# Patient Record
Sex: Male | Born: 1970 | Race: White | Hispanic: No | Smoking: Former smoker
Health system: Southern US, Community
[De-identification: ages and names within clinical notes are randomized; demographics above are authoritative.]

## PROBLEM LIST (undated history)

## (undated) DIAGNOSIS — G8929 Other chronic pain: Secondary | ICD-10-CM

## (undated) DIAGNOSIS — T50902A Poisoning by unspecified drugs, medicaments and biological substances, intentional self-harm, initial encounter: Secondary | ICD-10-CM

## (undated) DIAGNOSIS — M549 Dorsalgia, unspecified: Secondary | ICD-10-CM

## (undated) HISTORY — PX: OTHER SURGICAL HISTORY: SHX169

---

## 2001-11-16 ENCOUNTER — Inpatient Hospital Stay (HOSPITAL_COMMUNITY): Admission: EM | Admit: 2001-11-16 | Discharge: 2001-11-18 | Payer: Self-pay | Admitting: Psychiatry

## 2005-08-05 ENCOUNTER — Inpatient Hospital Stay (HOSPITAL_COMMUNITY): Admission: RE | Admit: 2005-08-05 | Discharge: 2005-08-09 | Payer: Self-pay | Admitting: Psychiatry

## 2005-08-06 ENCOUNTER — Ambulatory Visit: Payer: Self-pay | Admitting: Psychiatry

## 2005-12-31 ENCOUNTER — Inpatient Hospital Stay (HOSPITAL_COMMUNITY): Admission: AD | Admit: 2005-12-31 | Discharge: 2006-01-02 | Payer: Self-pay | Admitting: Psychiatry

## 2005-12-31 ENCOUNTER — Ambulatory Visit: Payer: Self-pay | Admitting: Psychiatry

## 2006-07-16 ENCOUNTER — Inpatient Hospital Stay (HOSPITAL_COMMUNITY): Admission: AD | Admit: 2006-07-16 | Discharge: 2006-07-18 | Payer: Self-pay | Admitting: Psychiatry

## 2006-07-16 ENCOUNTER — Ambulatory Visit: Payer: Self-pay | Admitting: Psychiatry

## 2007-11-20 ENCOUNTER — Other Ambulatory Visit: Payer: Self-pay | Admitting: Emergency Medicine

## 2007-11-20 ENCOUNTER — Ambulatory Visit: Payer: Self-pay | Admitting: Psychiatry

## 2007-11-20 ENCOUNTER — Inpatient Hospital Stay (HOSPITAL_COMMUNITY): Admission: AD | Admit: 2007-11-20 | Discharge: 2007-11-23 | Payer: Self-pay | Admitting: Psychiatry

## 2008-10-01 ENCOUNTER — Emergency Department (HOSPITAL_BASED_OUTPATIENT_CLINIC_OR_DEPARTMENT_OTHER): Admission: EM | Admit: 2008-10-01 | Discharge: 2008-10-01 | Payer: Self-pay | Admitting: Emergency Medicine

## 2008-12-09 ENCOUNTER — Ambulatory Visit: Payer: Self-pay | Admitting: Diagnostic Radiology

## 2008-12-09 ENCOUNTER — Emergency Department (HOSPITAL_BASED_OUTPATIENT_CLINIC_OR_DEPARTMENT_OTHER): Admission: EM | Admit: 2008-12-09 | Discharge: 2008-12-10 | Payer: Self-pay | Admitting: Emergency Medicine

## 2009-01-09 ENCOUNTER — Other Ambulatory Visit: Payer: Self-pay

## 2009-01-09 ENCOUNTER — Inpatient Hospital Stay (HOSPITAL_COMMUNITY): Admission: AD | Admit: 2009-01-09 | Discharge: 2009-01-12 | Payer: Self-pay | Admitting: Psychiatry

## 2009-01-09 ENCOUNTER — Ambulatory Visit: Payer: Self-pay | Admitting: Psychiatry

## 2010-04-29 ENCOUNTER — Emergency Department (HOSPITAL_BASED_OUTPATIENT_CLINIC_OR_DEPARTMENT_OTHER)
Admission: EM | Admit: 2010-04-29 | Discharge: 2010-04-29 | Disposition: A | Discharge status: Home / Self Care | Payer: Self-pay | Attending: Emergency Medicine | Admitting: Emergency Medicine

## 2010-04-29 ENCOUNTER — Emergency Department (INDEPENDENT_AMBULATORY_CARE_PROVIDER_SITE_OTHER): Payer: Self-pay

## 2010-04-29 DIAGNOSIS — N4340 Spermatocele of epididymis, unspecified: Secondary | ICD-10-CM | POA: Insufficient documentation

## 2010-04-29 DIAGNOSIS — N509 Disorder of male genital organs, unspecified: Secondary | ICD-10-CM

## 2010-04-29 DIAGNOSIS — F341 Dysthymic disorder: Secondary | ICD-10-CM | POA: Insufficient documentation

## 2010-04-29 LAB — URINALYSIS, ROUTINE W REFLEX MICROSCOPIC
Bilirubin Urine: NEGATIVE
Glucose, UA: NEGATIVE mg/dL
Hgb urine dipstick: NEGATIVE
Ketones, ur: NEGATIVE mg/dL
Protein, ur: NEGATIVE mg/dL
Specific Gravity, Urine: 1.008 (ref 1.005–1.030)

## 2010-05-29 LAB — CBC
Hemoglobin: 15.3 g/dL (ref 13.0–17.0)
Platelets: 270 10*3/uL (ref 150–400)
RBC: 5.05 MIL/uL (ref 4.22–5.81)
WBC: 6.1 10*3/uL (ref 4.0–10.5)

## 2010-05-29 LAB — BASIC METABOLIC PANEL
BUN: 7 mg/dL (ref 6–23)
Creatinine, Ser: 1.09 mg/dL (ref 0.4–1.5)
GFR calc Af Amer: >60 mL/min (ref >60)
GFR calc non Af Amer: >60 mL/min (ref >60)

## 2010-05-29 LAB — URINALYSIS, ROUTINE W REFLEX MICROSCOPIC
Bilirubin Urine: NEGATIVE
Glucose, UA: NEGATIVE mg/dL
Hgb urine dipstick: NEGATIVE
Specific Gravity, Urine: 1.021 (ref 1.005–1.030)
pH: 6.5 (ref 5.0–8.0)

## 2010-05-29 LAB — DIFFERENTIAL
Basophils Relative: 0 % (ref 0–1)
Eosinophils Absolute: 0.3 10*3/uL (ref 0.0–0.7)
Lymphocytes Relative: 37 % (ref 12–46)
Lymphs Abs: 2.3 10*3/uL (ref 0.7–4.0)
Monocytes Absolute: 0.8 10*3/uL (ref 0.1–1.0)
Monocytes Relative: 14 % — ABNORMAL HIGH (ref 3–12)
Neutrophils Relative %: 44 % (ref 43–77)

## 2010-05-29 LAB — RAPID URINE DRUG SCREEN, HOSP PERFORMED
Amphetamines: NOT DETECTED
Opiates: NOT DETECTED
Tetrahydrocannabinol: NOT DETECTED

## 2010-06-26 ENCOUNTER — Emergency Department (HOSPITAL_COMMUNITY)
Admission: EM | Admit: 2010-06-26 | Discharge: 2010-06-27 | Disposition: A | Discharge status: Other Acute Inpatient Hospital | Payer: Self-pay | Attending: Emergency Medicine | Admitting: Emergency Medicine

## 2010-06-26 DIAGNOSIS — F329 Major depressive disorder, single episode, unspecified: Secondary | ICD-10-CM | POA: Insufficient documentation

## 2010-06-26 DIAGNOSIS — R45851 Suicidal ideations: Secondary | ICD-10-CM | POA: Insufficient documentation

## 2010-06-26 DIAGNOSIS — F1910 Other psychoactive substance abuse, uncomplicated: Secondary | ICD-10-CM | POA: Insufficient documentation

## 2010-06-26 DIAGNOSIS — F3289 Other specified depressive episodes: Secondary | ICD-10-CM | POA: Insufficient documentation

## 2010-06-26 LAB — RAPID URINE DRUG SCREEN, HOSP PERFORMED
Amphetamines: NOT DETECTED
Cocaine: POSITIVE — AB
Opiates: POSITIVE — AB

## 2010-06-26 LAB — URINALYSIS, ROUTINE W REFLEX MICROSCOPIC
Bilirubin Urine: NEGATIVE
Hgb urine dipstick: NEGATIVE
Nitrite: NEGATIVE
Specific Gravity, Urine: 1.01 (ref 1.005–1.030)
Urobilinogen, UA: 0.2 mg/dL (ref 0.0–1.0)

## 2010-06-26 LAB — BASIC METABOLIC PANEL
Calcium: 9.4 mg/dL (ref 8.4–10.5)
GFR calc Af Amer: >60 mL/min (ref >60)
Glucose, Bld: 94 mg/dL (ref 70–99)
Potassium: 3.7 mEq/L (ref 3.5–5.1)
Sodium: 139 mEq/L (ref 135–145)

## 2010-06-26 LAB — CBC
HCT: 43 % (ref 39.0–52.0)
MCH: 30.3 pg (ref 26.0–34.0)
MCHC: 36 g/dL (ref 30.0–36.0)
MCV: 84 fL (ref 78.0–100.0)

## 2010-06-26 LAB — DIFFERENTIAL
Basophils Absolute: 0 10*3/uL (ref 0.0–0.1)
Eosinophils Absolute: 0.1 10*3/uL (ref 0.0–0.7)
Eosinophils Relative: 1 % (ref 0–5)
Neutro Abs: 5 10*3/uL (ref 1.7–7.7)

## 2010-06-27 ENCOUNTER — Inpatient Hospital Stay (HOSPITAL_COMMUNITY)
Admission: AD | Admit: 2010-06-27 | Discharge: 2010-06-28 | DRG: 897 | Disposition: A | Discharge status: Home / Self Care | Payer: PRIVATE HEALTH INSURANCE | Source: Ambulatory Visit | Attending: Psychiatry | Admitting: Psychiatry

## 2010-06-27 DIAGNOSIS — F3289 Other specified depressive episodes: Secondary | ICD-10-CM

## 2010-06-27 DIAGNOSIS — F329 Major depressive disorder, single episode, unspecified: Secondary | ICD-10-CM

## 2010-06-27 DIAGNOSIS — F1120 Opioid dependence, uncomplicated: Principal | ICD-10-CM

## 2010-06-27 DIAGNOSIS — F1910 Other psychoactive substance abuse, uncomplicated: Secondary | ICD-10-CM

## 2010-06-27 LAB — HEPATIC FUNCTION PANEL
ALT: 12 U/L (ref 0–53)
Alkaline Phosphatase: 70 U/L (ref 39–117)
Bilirubin, Direct: 0.1 mg/dL (ref 0.0–0.3)
Total Bilirubin: 0.3 mg/dL (ref 0.3–1.2)

## 2010-06-28 DIAGNOSIS — F1920 Other psychoactive substance dependence, uncomplicated: Secondary | ICD-10-CM

## 2010-07-12 NOTE — Discharge Summary (Signed)
NAME:  Marcus [name], Marcus [name] NO.:  [id number]   MEDICAL RECORD NO.:  [id number]          PATIENT TYPE:  IPS   LOCATION:  0303                          FACILITY:  [facility]   PHYSICIAN:  Anselm Jungling, MD  DATE OF BIRTH:  [birth year]   DATE OF ADMISSION:  12/31/2005  DATE OF DISCHARGE:  01/02/2006                               DISCHARGE SUMMARY   IDENTIFYING DATA AND REASON FOR ADMISSION:  The patient is a 40 year old  separated white male, homeless, unemployed, who was seeking  detoxification from poly-substance abuse.  This was his third [facility] admit.  Apparently he had made no effort to follow-up on discharge and aftercare  recommendations following his last admission to be [facility].  Please refer to  the admission note for further details pertaining to the symptoms,  circumstances and history that led to his hospitalization.  Please also  refer to previous admission and discharge summaries.  He was given  initial Axis I diagnosis of polysubstance abuse/dependence.   MEDICAL AND LABORATORY:  The patient was medically and physically  evaluated by the psychiatric nurse practitioner.  He was in good health,  essentially, without any active or chronic medical problems.   HOSPITAL COURSE:  The patient was admitted to the adult inpatient  psychiatric service.  He presented as a well nourished, well developed  male who initially was disheveled, tired, depressed, without any signs  or symptoms of psychosis.  He was involved in milieu therapy and placed  on a detoxification protocol of Librium 25 mg p.r.n.  In addition,  Seroquel 25 mg p.o. t.i.d. was initiated to address ongoing problems  with agitation and sleep.  Symmetrel 100 mg b.i.d. was initiated to  address cocaine cravings. Effexor XR 112.5 mg daily was added to address  depression.  The patient expressed interest in residential chemical  dependency treatment, which we supported, but due to his funding  situation, this was not  possible.  The patient was also initiated on a  trial of Lexapro 10 mg daily, to which he agreed.  This was well  tolerated.  On the third hospital day, the patient indicated that, since  there was no option for residential treatment, that he wished to re-  enter a methadone program at [facility], with whom he had consulted in  the past.  He indicated that he wanted to do that on that very day.  At  this point, the patient did not appear to require any further inpatient  treatment.  He was discharged so that he could complete his plan to  connect with the above program.   AFTERCARE:  The patient was discharged with a plan to follow up at [facility] on January 05, 2006, and with [facility] on January 08, 2006.   DISCHARGE MEDICATIONS:  Symmetrel 100 mg b.i.d., Effexor XR 175 mg  daily, Lexapro 10 mg daily, and Risperdal 0.5 mg t.i.d., which was  substituted for Seroquel, as the patient described the Seroquel as being  excessively sedating.   DISCHARGE DIAGNOSES:  AXIS I:  Polysubstance abuse/dependence and depressive disorder  NOS.  AXIS II:          Deferred.  AXIS III:         No acute or chronic illnesses.  AXIS IV:          Stressors severe.  AXIS V:           GAF on discharge 60.      Anselm Jungling, MD  Electronically Signed     SPB/MEDQ  D:  02/04/2006  T:  02/04/2006  Job:  [id number]

## 2010-07-12 NOTE — H&P (Signed)
NAME:  Marcus [name], Marcus [name] NO.:  [id number]   MEDICAL RECORD NO.:  [id number]          PATIENT TYPE:  IPS   LOCATION:  0301                          FACILITY:  [facility]   PHYSICIAN:  Landry Corporal, N.P.    DATE OF BIRTH:  1970/06/26   DATE OF ADMISSION:  08/05/2005  DATE OF DISCHARGE:                         PSYCHIATRIC ADMISSION ASSESSMENT   A single white male involuntarily admitted August 05, 2005.   HISTORY OF PRESENT ILLNESS:  The patient presents with a history of  substance abuse and depressive symptoms. Has been using OxyContin, taking up  to 5 to 6 a day, along with some Vicodin and Percocet. Has been using for  the past 3 to 4 months. Has been taking up to anywhere from 15 to 20 Vicodin  or Percocets daily. His last use was on August 05, 2005. The patient has been  tapering himself off of his medications for the past 2 weeks, with his last  use on August 05, 2005. He also reports some recent Valium use as well. He  wants to stop using drugs, to be there for his child. He is tired of his  inactivity and lack of motivation. He denies any suicidal or homicidal  thoughts or psychotic symptoms.   PAST PSYCHIATRIC HISTORY:  The patient was here in 2003 when he overdosed on  Xanax and was drinking. In the past, he has been on Wellbutrin and Effexor.  He has been on Effexor for approximately 2 years.   SOCIAL HISTORY:  This is a 40 year old single white male who has a 47-year-  old son. He lives with his girlfriend and his child. He is a Naval architect.  He denies any legal issues. He has 10 years of education.   FAMILY HISTORY:  Denies.   ALCOHOL AND DRUG HISTORY:  The patient chews tobacco. He drinks on occasion.  Denies any IV drug use.   PRIMARY CARE PHYSICIAN:  Unknown.   MEDICAL PROBLEMS:  The patient states he has chronic back pain and is unable  to see his family doctor due to his urine drug screen being positive for  marijuana.   MEDICATIONS:  None.   ALLERGIES:  FLEXERIL for muscle pain.   PHYSICAL EXAMINATION:  The patient was assessed at [facility].  Temperature was 97.1, 59 heart rate, 16 respirations, blood pressure 147/90.  He is 182 pounds, approximately 5 feet 10 inches tall. He is a healthy  appearing male.   LABORATORY DATA:  TSH is 1.941. Urinalysis is negative. Glucose is 124.  Alcohol level is less than 0.01. Urine drug screen is positive for  barbiturates, positive for benzodiazepines, positive for cocaine, positive  for marijuana. Acetaminophen level was high at 25.6; repeat acetaminophen  level at 10.6.   MENTAL STATUS EXAMINATION:  He is a fully alert, cooperative, casually  dressed male. Fair eye contact. Speech is clear. The patient feels  depressed. The patient's affect is flat. Thought processes coherent. There  is no evidence of psychosis. __________ intact. Memory is good. Judgment is  fair. Insight is fair. Poor impulse control.   AXIS I:  Polysubstance dependence. Depressive  disorder, not otherwise  specified.  AXIS II:  Deferred.  AXIS III:  Back pain.  AXIS IV:  Medical problems. Other psychosocial problems.  AXIS V:  Current is 30.   PLAN:  The plan is to stabilize mood and thinking, contract for safety. Will  detox the patient with Librium protocol. Work on relapse prevention. Will  resume Effexor. The patient reports benefits from Effexor. Risks and  benefits of the medication were discussed. Case manager to look over  potential rehabilitation programs. Will consider family session with his  girlfriend. Tentative length of stay is 5 to 6 days.      Landry Corporal, N.P.     JO/MEDQ  D:  08/06/2005  T:  08/07/2005  Job:  [id number]

## 2010-07-12 NOTE — H&P (Signed)
NAME:  Marcus [name], Marcus [name]                     ACCOUNT NO.:  [id number]   MEDICAL RECORD NO.:  [id number]                   PATIENT TYPE:  IPS   LOCATION:  0301                                 FACILITY:  [facility]   PHYSICIAN:  Jeanice Lim, M.D.              DATE OF BIRTH:  11/23/1970   DATE OF ADMISSION:  11/16/2001  DATE OF DISCHARGE:                         PSYCHIATRIC ADMISSION ASSESSMENT   IDENTIFYING INFORMATION:  This is a 40 year old white male who is currently  divorced and an involuntary admission.   HISTORY OF PRESENT ILLNESS:  This patient presented to the emergency room  after an intentional ingestion of approximately 16 tablets of Xanax 0.5 mg  that belonged to his girlfriend.  He denies any abuse of benzodiazepines.  He reports that he was just feeling really badly yesterday, had been quite  depressed, did not go to work as he usually does and just felt so terrible  that he decided to take pills about 2:00 in the afternoon.  He reports that  he had visited with his brother over the weekend.  His brother had come to  see him and he felt inadequate compared to his brother, who was currently in  the army and getting ready to go to the war in [place], that he felt inadequate  next to him, inadequate and unsuccessful.  The patient endorses a long  history of depression for many weeks and possibly months, feeling anhedonic,  unable to enjoy things anymore or find any happiness.  He also endorses  chronic dull headaches, irritability and inability to concentrate, unable to  sleep more than three hours a night, four maximum.  He states he usually  falls asleep around 12:30 or 1 in the morning and then rapidly reawakens and  wanders the house at night.  He reports his appetite is veracious but he has  no weight change.  The patient reports intermittent suicidal thoughts but  denies any specific plan until he took the medication impulsively.  He does  have regular access to hunting  weapons and hunts frequently.  He denies any  homicidal ideation.  Denies any feelings of paranoia.  Denies any  hallucinations.  He has episodes of irritability and anger but he gives no  history of symptoms of mania.   PAST PSYCHIATRIC HISTORY:  This is patient's first inpatient psychiatric  admission, first admission to [facility].  He was  diagnosed with depression previously approximately six years ago, when he  lived in [place] and was treated at that time with Effexor, which he  felt worked quite well for him.  He stopped cold [place] on his own and  suffered considerable withdrawal symptoms because he did not have any health  insurance to pay for the medication at the time.  He also had considerable  sexual side effects on the Effexor with delayed ejaculation but states the  side effects are not his concern  at this time.  He also has tried Zoloft,  which made him dizzy and made him feel sick to his stomach.  He denies any  prior history of suicide attempts.   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  He has been unable to work regularly because of his symptoms that  bothered him and he has felt so depressed.  He has one previous marriage.  Currently lives with his fiance and 68-month-old son.  He states he would  like to get married again if he could just get his act together.  The  patient was raised by his grandparents and the reasons for that are, at this  point, unclear.   FAMILY HISTORY:  Father with history of alcohol abuse.   ALCOHOL/DRUG HISTORY:  The patient drank several beers yesterday prior to  overdosing on the Xanax.  He endorses using alcohol approximately weekly,  binges with his friends but he denies any addiction, denies any withdrawal  symptoms.  He has used cocaine weekly over the past several weeks and began  using cocaine at age 29.  He has also used marijuana on a regular  basis  since age 65.   PAST MEDICAL HISTORY:  The patient has no regular primary care Vernee Baines.  Medical problems are kind of a chronic dull headache and chronic back pain  related to muscle strains and work.  No history of seizures.   MEDICATIONS:  None.   ALLERGIES:  FLEXARIL.   POSITIVE PHYSICAL FINDINGS:  The patient's physical examination was done at  the [facility] in the emergency room and is essentially  unremarkable.  He was observed for some time and did receive IV fluids.  He  was involuntarily petitioned after he was being resistant to staff and  threatening to leave the emergency room and rip his IV out.  He has been  cooperative on the unit.  Vital signs, on admission, were temperature 98.3,  pulse 67, respirations 20, blood pressure 126/82.  He is 6 feet 1 inch tall  and weighed 174 pounds.   LABORATORY DATA:  Urine drug screen includes positive results for  benzodiazepines, cannabinoids and cocaine.  Alcohol level is 15.  Basic  metabolic panel reveals his chloride mildly elevated at 108.  Other  electrolytes are within normal limits.  BUN 5, creatinine 0.8.   MENTAL STATUS EXAM:  This is a healthy, medium-built, muscular male fully  alert and in no acute distress.  He has quite a depressed affect.  He is  cooperative and pleasant, polite and emotionally available and direct about  his concerns.  Speech is normal.  Mood is depressed and mildly irritable.  He minimizes and has considerable denial regarding his substance abuse and  its role in how it affects his mood.  Thought process is logical without  deficits.  He is concerned about his depressive symptoms and has good  insight, recognizing that he is just not able to find any happiness anymore  and is interested primarily in regaining a normal sleep/wake pattern and  alleviating his depressive symptoms.  He does continue to have suicidal ideation but is able to contract for safety on the unit and  has no specific  plan.  He does have access to hunting weapons and hunts frequently.  No  evidence of homicidal ideation.  No evidence of psychosis.  No paranoia.  No  internal stimulation.  Cognitively, he is  intact x 3.   DIAGNOSES:   AXIS I:  1. Major depressive disorder, recurrent, severe.  2. Polysubstance abuse; rule out dependence.   AXIS II:  Deferred.   AXIS III:  1. Status post benzodiazepine overdose.  2. Headache not otherwise specified.   AXIS IV:  Moderate (financial problems).   AXIS V:  Current 24; past year 1.   PLAN:  Involuntarily commit the patient to evaluate his mood and to  alleviate his neurovegetative symptoms and alleviate his suicidal ideation.  He is able to contract for safety on the unit.  We have him on 15-minute  checks.  We will immediately begin intensive group and individual  psychotherapy and, today, we did considerable counseling related to  substance abuse and its effects on his brain in combination with his  depression.  We have elected to start  him on Prozac 20 mg p.o. daily and Librium 25 mg p.o. q.6h. p.r.n. for any  withdrawal symptoms.  He is also on trazodone 50 mg p.o. q.h.s.  His thyroid  panel is currently pending.   ESTIMATED LENGTH OF STAY:  Five days.     Margaret A. Stephannie Peters                   Jeanice Lim, M.D.    MAS/MEDQ  D:  11/16/2001  T:  11/17/2001  Job:  [phone]

## 2010-07-12 NOTE — Discharge Summary (Signed)
NAME:  BRASON, BERTHELOT NO.:  [id number]   MEDICAL RECORD NO.:  [id number]          PATIENT TYPE:  IPS   LOCATION:  0508                          FACILITY:  [facility]   PHYSICIAN:  Geoffery Lyons, M.D.      DATE OF BIRTH:  10-22-1970   DATE OF ADMISSION:  11/20/2007  DATE OF DISCHARGE:  11/23/2007                               DISCHARGE SUMMARY   CHIEF COMPLAINT/PRESENT ILLNESS:  This was one of several admissions to  [facility] for this 40 year old male that requested  admission.  Endorsed he was depressed about losing his job, cannot  support his family, persistent use of opiates.  He had been using  morphine and OxyContin, crushing and snoring or swallowing it.  When he  does not take it, he goes through withdrawal with cramps, sweats and  diarrhea.   PAST PSYCHIATRIC HISTORY:  Last time admitted in 2007.  No current  follow-up.  [facility]  He had been seen in [facility] and [facility].  Multiple trials of SSRIs, but nothing at this time.   ALCOHOL AND DRUG HISTORY:  As already stated, persistence use of  opiates.   MEDICAL HISTORY:  Noncontributory.   MEDICATIONS:  1. Effexor XR 150 mg per day.  2. Klonopin as needed.   Physical exam failed to show any acute findings   LABORATORY WORK:  Results not available in the chart.   MENTAL STATUS EXAMINATION:  Reveals an alert cooperative male.  Mood  anxious, depressed.  Affect anxious, depressed.  Thought process is  logical, coherent and relevant.  Endorsed suicidal ruminations, no plan,  no intent.  Feeling overwhelmed with the way things have been going.  Dealing with a lot of shame and guilt for his dependence on opiates.  Also dealing with losing his job.  No delusions.  No hallucinations.  Cognition well-preserved.   ADMITTING DIAGNOSES:  AXIS I:  Opiate dependence.  Depressive disorder,  not otherwise specified.  AXIS II:  No diagnosis.  AXIS III:  No diagnosis.  AXIS IV:   Moderate.  AXIS V:  Global Assessment of Functioning upon admission 35, highest  Global Assessment of Functioning in the last year 60.   COURSE IN THE HOSPITAL:  He was admitted, started in individual and  group psychotherapy.  He was able to settle down.  We detoxed with  clonidine.  He was maintained on the Effexor.  He was given some Ambien  for sleep.  He did admit that he was also using cocaine, heroin and  drinking.  As the hospitalization progressed, he endorsed long-term  issues with opiates, having a hard time anticipating or thinking that he  was not going to be on opiates anymore.  Prior history of issues with  cravings and persistent relapses.  Concerned about his ability to  maintain abstinence.  He has a girlfriend who is pregnant with his  child.  He has a 47-year-old son that he takes care of, and he did not  want to risk relapsing again.  He had been on [facility], taking methadone.  Explored the  possibility of using Suboxone given the fact of the  chronicity of his opiate dependency and his inability to stay abstinent,  and the fact that he was actively withdrawing, we went ahead and  starting using Suboxone.  He tolerated the Suboxone quite well as far as  the withdrawal, as well as the craving.  He was able to find out that  [facility] was going to maintain him on the Suboxone, so went ahead and  discharged to outpatient follow-up.  Upon discharge, more hopeful in  that he has a different strategy to deal with the opiate dependency.  Willing to pursue outpatient treatment.   DISCHARGE DIAGNOSES:  AXIS I:  Opiate dependence, cocaine, marijuana  abuse.  Depressive disorder, not otherwise specified.  AXIS II:  No  diagnosis.  AXIS III:  No diagnosis.  AXIS IV:  Moderate.  AXIS V:  Global Assessment of Functioning upon discharge 55-60.   Discharged on Suboxone 8 mg 1/2 in the morning and 1/2 in the afternoon,  Effexor XR 150 mg per day and Ambien 10 at bedtime for sleep.  Follow  up  at [facility].      Geoffery Lyons, M.D.  Electronically Signed     IL/MEDQ  D:  12/07/2007  T:  12/07/2007  Job:  [id number]

## 2010-07-12 NOTE — Discharge Summary (Signed)
NAME:  Marcus [name], KRIST NO.:  [id number]   MEDICAL RECORD NO.:  [id number]          PATIENT TYPE:  IPS   LOCATION:  0301                          FACILITY:  [facility]   PHYSICIAN:  Geoffery Lyons, M.D.      DATE OF BIRTH:  Jun 13, 1970   DATE OF ADMISSION:  08/05/2005  DATE OF DISCHARGE:  08/09/2005                                 DISCHARGE SUMMARY   CHIEF COMPLAINT AND PRESENT ILLNESS:  This was the second admission to [facility] for this 40 year old male.  History of substance  abuse and depressive symptoms.  Using OxyContin, taking up to 5 or 6 per day  along with some Vicodin and Percocet.  Had been using for the past 3-4  months.  Had been taking up to anywhere from 15-20 Vicodin or Percocet.  Last use was August 05, 2005.  Has been tapering himself off his medication  over the past two weeks.  Last use August 05, 2005.  Also reports some recent  Valium use.  Wants to stop using drugs to be there for his child.  Endorsed  tiredness, inactivity, lack of motivation.   PAST PSYCHIATRIC HISTORY:  Was admitted in 2003 when he overdosed on Xanax.  Was drinking.  He had been on Wellbutrin and Effexor in the past.   ALCOHOL/DRUG HISTORY:  As already stated, persistent use of opioids.  Drinks  on occasion.  As of recently, had gotten on Valium.   MEDICAL HISTORY:  Chronic back pain.   MEDICATIONS:  None.   PHYSICAL EXAMINATION:  Performed and failed to show any acute findings.   LABORATORY DATA:  Blood chemistry with sodium 141, potassium 4.2, glucose  94.  Liver enzymes with SGOT 23, SGPT 36, total bilirubin 0.8.  TSH 1.941.   MENTAL STATUS EXAM:  Alert, cooperative male.  Casually dressed.  Fair eye  contact.  Speech was clear, normal rate, tempo and production.  Feeling  depressed and anxious.  Affect is constricted.  No delusions.  No suicidal  or homicidal ideation.  Cognition was well-preserved.   ADMISSION DIAGNOSES:  AXIS I:  Polysubstance  dependence.  Depressive  disorder not otherwise specified.  AXIS II:  No diagnosis.  AXIS III:  Back pain.  AXIS IV:  Moderate.  AXIS V:  GAF upon admission 30; highest GAF in the last year 60.   LABORATORY DATA:  Urine drug screen was positive for benzodiazepines,  cocaine and marijuana.   HOSPITAL COURSE:  He was admitted.  He was detoxified with Librium and  clonidine.  He was given trazodone for sleep.  He was given Symmetrel 100 mg  twice a day.  He was started on Effexor and Neurontin.  He endorsed he had  been messing up.  He was treated for chronic pain.  UDS was positive for  marijuana.  He was fired from the clinic.  Endorsed increased anxiety,  always jittery and there was underlying depression.  Endorsed no energy, no  motivations.  Doing better on Effexor.  Mood fluctuation.  Increased use of  opiates as well as cocaine.  Affecting his  job, relationship.  Committed to  abstaining.  Concerned about not being able to use benzodiazepines as he  used Valium in the afternoon, makes him feel so much better.  After he was  in [facility] in 2003, he failed to follow up.  Family  session with his girlfriend.  He was endorsing that he was committed to  abstinence.  He felt he was ready to move on, more aware of the underlying  depression, anger spells, anxiety.  We worked on the Effexor and the  Neurontin.  He started feeling better and, on August 09, 2005, he was in full  contact with reality.  There were no active suicidal or homicidal ideation.  He felt committed to abstinence.  His mood had improved.  He had taken some  Seroquel during his stay.  Felt it was beneficial.  Endorsed that he was not  allergic to it as initially stated.  As he was in full contact with reality,  fully detoxed, no active suicidal or homicidal ideation, we went ahead and  discharged to outpatient follow-up.   DISCHARGE DIAGNOSES:  AXIS I:  Polysubstance dependence.  Depressive   disorder not otherwise specified.  Anxiety disorder not otherwise specified.  AXIS II:  No diagnosis.  AXIS III:  Degenerative disk disease.  AXIS IV:  Moderate.  AXIS V:  GAF upon discharge 50-55.   DISCHARGE MEDICATIONS:  1.  Effexor XR with plans to go 75 mg for four days, then 112.5 mg for four      days, then 150 mg per day.  2.  Neurontin 300 mg three times a day.  3.  Symmetrel 100 mg twice a day.  4.  Trazodone 50 mg at bedtime.  5.  Seroquel 50 mg every six hours as needed.   FOLLOWUP:  [facility].      Geoffery Lyons, M.D.  Electronically Signed     IL/MEDQ  D:  08/26/2005  T:  08/26/2005  Job:  [id number]
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
## 2010-09-11 NOTE — Assessment & Plan Note (Signed)
  NAME:  ANIRUDDH, CIAVARELLA NO.:  [id number]  MEDICAL RECORD NO.:  [id number]  LOCATION:                                 FACILITY:  PHYSICIAN:  Anselm Jungling, MD  DATE OF BIRTH:  March 05, 1970  DATE OF ADMISSION:  06/28/2010 DATE OF DISCHARGE:                      PSYCHIATRIC ADMISSION ASSESSMENT   This was a 40 year old male who was admitted to the adult milieu wanting to receive Suboxone.  He was making suicidal threats that if he did not receive Suboxone, that his mother would "have to bury him."  The patient was using heroin in addition to use of opiates.  Plan was on admission to place patient on the Clonidine protocol to aid with withdrawal symptoms from use of opiates.  The patient was requesting discharge.  He denied any suicidal thoughts but wanting referrals to outside resources to possibly get placed on Suboxone.  This note was done by reviewing records. This was a patient of Dr. Herbie Drape, N.P.   ______________________________ Anselm Jungling, MD    JO/MEDQ  D:  09/09/2010  T:  09/09/2010  Job:  [id number]  Electronically Signed by Limmie Patricia.P. on 09/09/2010 04:32:30 PM Electronically Signed by Nelly Rout MD on 09/11/2010 11:39:53 AM

## 2010-11-25 LAB — DIFFERENTIAL
Basophils Relative: 0
Eosinophils Relative: 1
Lymphocytes Relative: 22
Monocytes Relative: 6
Neutrophils Relative %: 70

## 2010-11-25 LAB — RAPID URINE DRUG SCREEN, HOSP PERFORMED
Benzodiazepines: POSITIVE — AB
Cocaine: POSITIVE — AB
Opiates: NOT DETECTED
Tetrahydrocannabinol: NOT DETECTED

## 2010-11-25 LAB — CBC
HCT: 43.1
Hemoglobin: 14.7
MCHC: 34
MCV: 93
Platelets: 313
RBC: 4.63
RDW: 13
WBC: 9.6

## 2010-11-25 LAB — BASIC METABOLIC PANEL
CO2: 31
GFR calc Af Amer: >60
GFR calc non Af Amer: >60
Glucose, Bld: 98

## 2010-11-25 LAB — TRICYCLICS SCREEN, URINE: TCA Scrn: NOT DETECTED

## 2010-11-25 LAB — ACETAMINOPHEN LEVEL: Acetaminophen (Tylenol), Serum: <10 — ABNORMAL LOW

## 2010-11-25 LAB — ETHANOL: Alcohol, Ethyl (B): 61 — ABNORMAL HIGH

## 2011-06-18 ENCOUNTER — Emergency Department (HOSPITAL_BASED_OUTPATIENT_CLINIC_OR_DEPARTMENT_OTHER)
Admission: EM | Admit: 2011-06-18 | Discharge: 2011-06-18 | Disposition: A | Discharge status: Home / Self Care | Payer: Medicaid Other | Attending: Emergency Medicine | Admitting: Emergency Medicine

## 2011-06-18 ENCOUNTER — Encounter (HOSPITAL_BASED_OUTPATIENT_CLINIC_OR_DEPARTMENT_OTHER): Payer: Self-pay | Admitting: Emergency Medicine

## 2011-06-18 DIAGNOSIS — M545 Low back pain, unspecified: Secondary | ICD-10-CM | POA: Insufficient documentation

## 2011-06-18 DIAGNOSIS — G8929 Other chronic pain: Secondary | ICD-10-CM | POA: Insufficient documentation

## 2011-06-18 HISTORY — DX: Dorsalgia, unspecified: M54.9

## 2011-06-18 HISTORY — DX: Other chronic pain: G89.29

## 2011-06-18 NOTE — ED Notes (Signed)
Pt states he has been out of medications  x 5 days, insurance reinstated today, talked with pain management office and they instructed him to come to ed for pain control, bilateral le numbness and weakness

## 2011-06-18 NOTE — ED Notes (Signed)
Pt thinks he may be having some withdrawal symptoms due to being out of medications x 5 days.

## 2011-06-18 NOTE — ED Notes (Signed)
Pt has chronic lower back pain. Pt states he has been out of pain medications x 5 days. Pt states he just got insurance reinstated today and has apt with pain management mon.

## 2012-05-18 ENCOUNTER — Encounter (HOSPITAL_COMMUNITY): Payer: Self-pay | Admitting: *Deleted

## 2012-05-18 ENCOUNTER — Emergency Department (HOSPITAL_COMMUNITY)
Admission: EM | Admit: 2012-05-18 | Discharge: 2012-05-19 | Disposition: A | Discharge status: Other Acute Inpatient Hospital | Payer: Medicaid Other | Source: Home / Self Care | Attending: Emergency Medicine | Admitting: Emergency Medicine

## 2012-05-18 DIAGNOSIS — F1110 Opioid abuse, uncomplicated: Secondary | ICD-10-CM | POA: Insufficient documentation

## 2012-05-18 DIAGNOSIS — F1310 Sedative, hypnotic or anxiolytic abuse, uncomplicated: Secondary | ICD-10-CM | POA: Insufficient documentation

## 2012-05-18 DIAGNOSIS — M549 Dorsalgia, unspecified: Secondary | ICD-10-CM | POA: Insufficient documentation

## 2012-05-18 DIAGNOSIS — G8929 Other chronic pain: Secondary | ICD-10-CM | POA: Insufficient documentation

## 2012-05-18 DIAGNOSIS — F1910 Other psychoactive substance abuse, uncomplicated: Secondary | ICD-10-CM

## 2012-05-18 DIAGNOSIS — R45851 Suicidal ideations: Secondary | ICD-10-CM | POA: Insufficient documentation

## 2012-05-18 DIAGNOSIS — J029 Acute pharyngitis, unspecified: Secondary | ICD-10-CM | POA: Insufficient documentation

## 2012-05-18 DIAGNOSIS — F39 Unspecified mood [affective] disorder: Secondary | ICD-10-CM | POA: Insufficient documentation

## 2012-05-18 LAB — COMPREHENSIVE METABOLIC PANEL
AST: 16 U/L (ref 0–37)
CO2: 31 mEq/L (ref 19–32)
GFR calc Af Amer: >90 mL/min (ref >90)
GFR calc non Af Amer: >90 mL/min (ref >90)
Glucose, Bld: 118 mg/dL — ABNORMAL HIGH (ref 70–99)
Sodium: 138 mEq/L (ref 135–145)
Total Protein: 8 g/dL (ref 6.0–8.3)

## 2012-05-18 LAB — CBC
MCHC: 35.1 g/dL (ref 30.0–36.0)
Platelets: 275 10*3/uL (ref 150–400)

## 2012-05-18 LAB — RAPID URINE DRUG SCREEN, HOSP PERFORMED
Amphetamines: NOT DETECTED
Opiates: POSITIVE — AB

## 2012-05-18 LAB — ACETAMINOPHEN LEVEL: Acetaminophen (Tylenol), Serum: <15 ug/mL (ref 10–30)

## 2012-05-18 LAB — SALICYLATE LEVEL: Salicylate Lvl: <2 mg/dL — ABNORMAL LOW (ref 2.8–20.0)

## 2012-05-18 MED ORDER — CLONIDINE HCL 0.1 MG PO TABS: 0.1000 mg | ORAL_TABLET | ORAL | Status: DC

## 2012-05-18 MED ORDER — HYDROXYZINE HCL 25 MG PO TABS
25.0000 mg | ORAL_TABLET | Freq: Four times a day (QID) | ORAL | Status: DC | PRN
  Administered 2012-05-18: 25 mg via ORAL
  Filled 2012-05-18: qty 1

## 2012-05-18 MED ORDER — ONDANSETRON 4 MG PO TBDP: 4.0000 mg | ORAL_TABLET | Freq: Four times a day (QID) | ORAL | Status: DC | PRN

## 2012-05-18 MED ORDER — DICYCLOMINE HCL 20 MG PO TABS: 20.0000 mg | ORAL_TABLET | Freq: Four times a day (QID) | ORAL | Status: DC | PRN

## 2012-05-18 MED ORDER — METHOCARBAMOL 500 MG PO TABS
500.0000 mg | ORAL_TABLET | Freq: Three times a day (TID) | ORAL | Status: DC | PRN
  Administered 2012-05-18: 250 mg via ORAL
  Filled 2012-05-18: qty 1

## 2012-05-18 MED ORDER — LOPERAMIDE HCL 2 MG PO CAPS: 2.0000 mg | ORAL_CAPSULE | ORAL | Status: DC | PRN

## 2012-05-18 MED ORDER — CLONIDINE HCL 0.1 MG PO TABS
0.1000 mg | ORAL_TABLET | Freq: Four times a day (QID) | ORAL | Status: DC
  Administered 2012-05-18: 0.1 mg via ORAL
  Filled 2012-05-18: qty 1

## 2012-05-18 MED ORDER — CLONIDINE HCL 0.1 MG PO TABS: 0.1000 mg | ORAL_TABLET | Freq: Every day | ORAL | Status: DC

## 2012-05-18 MED ORDER — NAPROXEN 500 MG PO TABS
500.0000 mg | ORAL_TABLET | Freq: Two times a day (BID) | ORAL | Status: DC | PRN
  Administered 2012-05-18: 500 mg via ORAL
  Filled 2012-05-18: qty 1

## 2012-05-18 NOTE — ED Notes (Signed)
Mesh bag and a personal bag in locker 34
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
## 2012-05-18 NOTE — ED Provider Notes (Signed)
History     CSN: [id number]  Arrival date & time 05/18/12  1735   First MD Initiated Contact with Patient 05/18/12 1836      Chief Complaint  Patient presents with  . Medical Clearance    (Consider location/radiation/quality/duration/timing/severity/associated sxs/prior treatment) The history is provided by the patient.   patient is requesting detox off of opiates. He will snort approximately 400 mg morphine day. If he is unable to get morphine will sometimes do other opiates such as Percocet or Vicodin. He'll also occasionally used some benzodiazepines. He states he uses opiates everyday. He has used other drugs in the past but not recently. He last used earlier today. He also has some depression and some mild suicidal thoughts. No chest pain no nausea vomiting or diarrhea. He states his chronic back pain. he denies alcohol use, and does not smoke.  Past Medical History  Diagnosis Date  . Chronic back pain     Past Surgical History  Procedure Laterality Date  . None      History reviewed. No pertinent family history.  History  Substance Use Topics  . Smoking status: Never Smoker   . Smokeless tobacco: Never Used  . Alcohol Use: Yes     Comment: occ      Review of Systems  Constitutional: Negative for activity change and appetite change.  HENT: Positive for sore throat. Negative for neck stiffness.   Eyes: Negative for pain.  Respiratory: Negative for chest tightness and shortness of breath.   Cardiovascular: Negative for chest pain and leg swelling.  Gastrointestinal: Negative for nausea, vomiting, abdominal pain and diarrhea.  Genitourinary: Negative for flank pain.  Musculoskeletal: Negative for back pain.  Skin: Negative for rash.  Neurological: Negative for weakness, numbness and headaches.  Psychiatric/Behavioral: Positive for suicidal ideas and dysphoric mood. Negative for behavioral problems.    Allergies  Review of patient's allergies indicates no known  allergies.  Home Medications  No current outpatient prescriptions on file.  BP 118/74  Pulse 74  Temp(Src) 97.4 F (36.3 C) (Oral)  Resp 20  SpO2 96%  Physical Exam  Nursing note and vitals reviewed. Constitutional: He is oriented to person, place, and time. He appears well-developed and well-nourished.  HENT:  Head: Normocephalic and atraumatic.  Right Ear: External ear normal.  Left Ear: External ear normal.  Mouth/Throat: Oropharynx is clear and moist. No oropharyngeal exudate.  Eyes: EOM are normal. Pupils are equal, round, and reactive to light.  Neck: Normal range of motion. Neck supple.  Cardiovascular: Normal rate, regular rhythm and normal heart sounds.   No murmur heard. Pulmonary/Chest: Effort normal and breath sounds normal.  Abdominal: Soft. Bowel sounds are normal. He exhibits no distension and no mass. There is no tenderness. There is no rebound and no guarding.  Musculoskeletal: Normal range of motion. He exhibits no edema.  Neurological: He is alert and oriented to person, place, and time. No cranial nerve deficit.  Skin: Skin is warm and dry.  Psychiatric: He has a normal mood and affect.    ED Course  Procedures (including critical care time)  Labs Reviewed  COMPREHENSIVE METABOLIC PANEL - Abnormal; Notable for the following:    Glucose, Bld 118 (*)    Total Bilirubin 0.1 (*)    All other components within normal limits  SALICYLATE LEVEL - Abnormal; Notable for the following:    Salicylate Lvl <2.0 (*)    All other components within normal limits  URINE RAPID DRUG SCREEN (HOSP PERFORMED) -  Abnormal; Notable for the following:    Opiates POSITIVE (*)    Benzodiazepines POSITIVE (*)    All other components within normal limits  ACETAMINOPHEN LEVEL  CBC  ETHANOL   No results found.   1. Substance abuse       MDM  Patient requesting detox off of opiates. He appears to medically cleared at this time. He'll be started on the [facility] withdrawal  protocol. He'll be seen by ACT team.        Juliet Rude. Rubin Payor, MD 05/18/12 [phone]

## 2012-05-18 NOTE — ED Notes (Signed)
2 bags place in locker 34.
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
## 2012-05-18 NOTE — ED Notes (Signed)
Pt from home accompanied by fiance requesting detox from Morphine, Vicodin and Percocet. Pt reports last using Morphine and Vicodin this morning around 1000. Pt also reports taking one Xanax about an hour ago. Pt reports SI with plan of shooting himself but reports that he does not own a gun. Pt denies HI or AV hallucinations.

## 2012-05-18 NOTE — ED Notes (Signed)
Security in to wand pt and pt's personal belongings. 

## 2012-05-18 NOTE — ED Notes (Signed)
MD at bedside. 

## 2012-05-18 NOTE — ED Notes (Signed)
[id number]> Expected date:<BR> Expected time:<BR> Means of arrival:<BR> Comments:<BR> Hold triage 3

## 2012-05-19 ENCOUNTER — Encounter (HOSPITAL_COMMUNITY): Payer: Self-pay

## 2012-05-19 ENCOUNTER — Inpatient Hospital Stay (HOSPITAL_COMMUNITY)
Admission: EM | Admit: 2012-05-19 | Discharge: 2012-05-24 | DRG: 897 | Disposition: A | Discharge status: Home / Self Care | Payer: Medicaid Other | Source: Intra-hospital | Attending: Psychiatry | Admitting: Psychiatry

## 2012-05-19 DIAGNOSIS — Z79899 Other long term (current) drug therapy: Secondary | ICD-10-CM

## 2012-05-19 DIAGNOSIS — F1110 Opioid abuse, uncomplicated: Principal | ICD-10-CM | POA: Diagnosis present

## 2012-05-19 DIAGNOSIS — F1310 Sedative, hypnotic or anxiolytic abuse, uncomplicated: Secondary | ICD-10-CM | POA: Diagnosis present

## 2012-05-19 DIAGNOSIS — G8929 Other chronic pain: Secondary | ICD-10-CM | POA: Diagnosis present

## 2012-05-19 DIAGNOSIS — F1120 Opioid dependence, uncomplicated: Secondary | ICD-10-CM

## 2012-05-19 DIAGNOSIS — F332 Major depressive disorder, recurrent severe without psychotic features: Secondary | ICD-10-CM

## 2012-05-19 DIAGNOSIS — M549 Dorsalgia, unspecified: Secondary | ICD-10-CM | POA: Diagnosis present

## 2012-05-19 MED ORDER — NICOTINE 14 MG/24HR TD PT24: 14.0000 mg | MEDICATED_PATCH | Freq: Every day | TRANSDERMAL | Status: DC

## 2012-05-19 MED ORDER — IBUPROFEN 600 MG PO TABS
600.0000 mg | ORAL_TABLET | Freq: Four times a day (QID) | ORAL | Status: DC | PRN
  Administered 2012-05-19 – 2012-05-24 (×8): 600 mg via ORAL
  Filled 2012-05-19 (×8): qty 1

## 2012-05-19 MED ORDER — CLONIDINE HCL 0.1 MG PO TABS
0.1000 mg | ORAL_TABLET | ORAL | Status: AC
  Administered 2012-05-21 – 2012-05-22 (×4): 0.1 mg via ORAL
  Filled 2012-05-19 (×4): qty 1

## 2012-05-19 MED ORDER — ALUM & MAG HYDROXIDE-SIMETH 200-200-20 MG/5ML PO SUSP: 30.0000 mL | ORAL | Status: DC | PRN

## 2012-05-19 MED ORDER — CLONIDINE HCL 0.1 MG PO TABS
0.1000 mg | ORAL_TABLET | Freq: Every day | ORAL | Status: AC
  Administered 2012-05-23 – 2012-05-24 (×2): 0.1 mg via ORAL
  Filled 2012-05-19 (×2): qty 1

## 2012-05-19 MED ORDER — NICOTINE POLACRILEX 2 MG MT GUM
2.0000 mg | CHEWING_GUM | OROMUCOSAL | Status: DC | PRN
  Administered 2012-05-19 – 2012-05-21 (×5): 2 mg via ORAL

## 2012-05-19 MED ORDER — METHOCARBAMOL 500 MG PO TABS
500.0000 mg | ORAL_TABLET | Freq: Three times a day (TID) | ORAL | Status: AC | PRN
  Administered 2012-05-20 (×2): 500 mg via ORAL
  Filled 2012-05-19 (×4): qty 1

## 2012-05-19 MED ORDER — NAPROXEN 500 MG PO TABS
500.0000 mg | ORAL_TABLET | Freq: Two times a day (BID) | ORAL | Status: AC | PRN
  Administered 2012-05-22: 500 mg via ORAL
  Filled 2012-05-19: qty 1

## 2012-05-19 MED ORDER — FLUOXETINE HCL 20 MG PO CAPS
20.0000 mg | ORAL_CAPSULE | Freq: Every day | ORAL | Status: DC
  Administered 2012-05-19 – 2012-05-23 (×5): 20 mg via ORAL
  Filled 2012-05-19 (×8): qty 1

## 2012-05-19 MED ORDER — ACETAMINOPHEN 325 MG PO TABS
650.0000 mg | ORAL_TABLET | Freq: Four times a day (QID) | ORAL | Status: DC | PRN
  Administered 2012-05-23 – 2012-05-24 (×2): 650 mg via ORAL

## 2012-05-19 MED ORDER — CLONIDINE HCL 0.1 MG PO TABS
0.1000 mg | ORAL_TABLET | Freq: Four times a day (QID) | ORAL | Status: AC
  Administered 2012-05-19 – 2012-05-20 (×8): 0.1 mg via ORAL
  Filled 2012-05-19 (×8): qty 1

## 2012-05-19 MED ORDER — LOPERAMIDE HCL 2 MG PO CAPS
2.0000 mg | ORAL_CAPSULE | ORAL | Status: AC | PRN
  Administered 2012-05-19: 4 mg via ORAL

## 2012-05-19 MED ORDER — ONDANSETRON 4 MG PO TBDP: 4.0000 mg | ORAL_TABLET | Freq: Four times a day (QID) | ORAL | Status: AC | PRN

## 2012-05-19 MED ORDER — TRAZODONE HCL 50 MG PO TABS
50.0000 mg | ORAL_TABLET | Freq: Every evening | ORAL | Status: DC | PRN
  Filled 2012-05-19 (×4): qty 1
  Filled 2012-05-19 (×2): qty 28
  Filled 2012-05-19 (×6): qty 1
  Filled 2012-05-19: qty 28
  Filled 2012-05-19 (×2): qty 1

## 2012-05-19 MED ORDER — HYDROXYZINE HCL 25 MG PO TABS
25.0000 mg | ORAL_TABLET | Freq: Four times a day (QID) | ORAL | Status: AC | PRN
  Administered 2012-05-19 – 2012-05-23 (×10): 25 mg via ORAL
  Filled 2012-05-19: qty 1

## 2012-05-19 MED ORDER — GABAPENTIN 300 MG PO CAPS
300.0000 mg | ORAL_CAPSULE | Freq: Three times a day (TID) | ORAL | Status: DC
  Administered 2012-05-19 – 2012-05-21 (×6): 300 mg via ORAL
  Filled 2012-05-19 (×12): qty 1

## 2012-05-19 MED ORDER — MAGNESIUM HYDROXIDE 400 MG/5ML PO SUSP: 30.0000 mL | Freq: Every day | ORAL | Status: DC | PRN

## 2012-05-19 MED ORDER — LAMOTRIGINE 25 MG PO TABS
25.0000 mg | ORAL_TABLET | Freq: Two times a day (BID) | ORAL | Status: DC
  Administered 2012-05-19 – 2012-05-21 (×4): 25 mg via ORAL
  Filled 2012-05-19 (×9): qty 1

## 2012-05-19 MED ORDER — DICYCLOMINE HCL 20 MG PO TABS: 20.0000 mg | ORAL_TABLET | Freq: Four times a day (QID) | ORAL | Status: AC | PRN

## 2012-05-19 NOTE — [facility] Counselor (Signed)
Patient accepted to [facility] by Donell Sievert, PA to Dr. Dub Mikes.

## 2012-05-19 NOTE — Progress Notes (Signed)
Patient ID: Marcus [name], male   DOB: [birth year], 42 y.o.   MRN: [id number]  D:  Pt was laying in bed during the assessment with his arms over his eyes. Informed the writer that he went to most of the groups until he got tired. "I was feeling rough".    A: Informed pt that writer would be giving him some medicine that would hopefully alleviate some of his withdrawal symptoms. Support and encouragement was offered. 15 min checks continued for safety.  R: Pt remains safe.

## 2012-05-19 NOTE — Tx Team (Signed)
Initial Interdisciplinary Treatment Plan  PATIENT STRENGTHS: (choose at least two) Ability for insight General fund of knowledge Motivation for treatment/growth  PATIENT STRESSORS: Financial difficulties Health problems Occupational concerns   PROBLEM LIST: Problem List/Patient Goals Date to be addressed Date deferred Reason deferred Estimated date of resolution  Substance Abuse      Depression      Risk for SI                                           DISCHARGE CRITERIA:  Ability to meet basic life and health needs Improved stabilization in mood, thinking, and/or behavior Verbal commitment to aftercare and medication compliance  PRELIMINARY DISCHARGE PLAN: Attend aftercare/continuing care group Attend PHP/IOP  PATIENT/FAMIILY INVOLVEMENT: This treatment plan has been presented to and reviewed with the patient, Marcus [name].  The patient and family have been given the opportunity to ask questions and make suggestions.  Delos Haring 05/19/2012, 6:36 AM

## 2012-05-19 NOTE — ED Provider Notes (Signed)
Patient accepted to [facility] by Dr. Dub Mikes.  BP 118/74  Pulse 74  Temp(Src) 97.4 F (36.3 C) (Oral)  Resp 20  SpO2 96%   Glynn Octave, MD 05/19/12 [phone]

## 2012-05-19 NOTE — ED Notes (Signed)
NO acute distress noted.

## 2012-05-19 NOTE — Tx Team (Signed)
Interdisciplinary Treatment Plan Update (Adult)  Date: 05/19/2012  Time Reviewed: 10:46 AM   Progress in Treatment: Attending groups: Not established; patient newly admitted today Participating in groups: Not established; patient newly admitted today Taking medication as prescribed:  Not established; patient newly admitted today Tolerating medication:  Not established; patient newly admitted today Family/Significant othe contact made: No Patient understands diagnosis: Yes Discussing patient identified problems/goals with staff: Yes Medical problems stabilized or resolved:  Not established; patient newly admitted today Denies suicidal/homicidal ideation: Yes Patient has not harmed self or Others: Yes  New problem(s) identified: None Identified  Discharge Plan or Barriers:  CSW to explore with patient  Additional comments: N/A  Reason for Continuation of Hospitalization: Anxiety  Depression Medication stabilization Withdrawal symptoms   Estimated length of stay: 3-5 days  For review of initial/current patient goals, please see plan of care.  Attendees: Patient:     Family:     Physician:  Serena Colonel PA 05/19/2012 10:46AM   Nursing:   Roswell Miners, RN 05/19/2012 10:46 AM   Clinical Social Worker Ronda Fairly 05/19/2012 10:46 AM   Other:  Liliane Bade, Community Care Cooordinator 05/19/2012 10:46 AM   Other:  Robbie Louis, RN 05/19/2012 10:46 AM   Other:     Other:      Scribe for Treatment Team:   Carney Bern, LCSWA  05/19/2012 10:46 AM

## 2012-05-19 NOTE — Progress Notes (Signed)
Patient ID: Marcus [name], male   DOB: [birth year], 42 y.o.   MRN: [id number] He has been in bed  Sleeping  Most of the day. He did get up for meals and medications.  Self Inventory: depression 5, hopelessness 8,Withdrawal of craving and aggression,  Denies SI thoughts. He received prn of motrin, vistaril and immodium  today at 10:04 that were effective.

## 2012-05-19 NOTE — Progress Notes (Signed)
[facility] LCSW Aftercare Discharge Planning Group Note  05/19/2012 8:45 AM Participation Quality:  Attentive, Intrusive and Sharing  Affect:  Anxious, Depressed, Flat and Irritable  Cognitive:  Oriented  Insight:  Limited  Engagement in Group:  Distracting  Modes of Intervention:  Clarification, Limit-setting, Rapport Building and Support  Summary of Progress/Problems:  Pt denies both suicidal and homicidal ideation.  On a scale of 1 to 10 with ten being the most ever experienced, the patient rates depression at a 7 and anxiety at a 7.  Noe was intrusive at multiple points yet responsive to limit setting.  Patient reports he is out of work due to multiple medical issues and is interested in replacement drug therapy ie a methadone clinic in or near [place].    [name] 05/19/2012, 7:31 PM

## 2012-05-19 NOTE — Progress Notes (Signed)
Adult Psychoeducational Group Note  Date:  05/19/2012 Time:  12:12 PM  Group Topic/Focus:  Coping With Mental Health Crisis:   The purpose of this group is to help patients identify strategies for coping with mental health crisis.  Group discusses possible causes of crisis and ways to manage them effectively.  Participation Level:  Active  Participation Quality:  Appropriate, Attentive and Sharing  Affect:  Appropriate  Cognitive:  Alert and Appropriate  Insight: Appropriate  Engagement in Group:  Engaged  Modes of Intervention:  Discussion  Additional Comments:  Pt was appropriate and attentive while attending group. Pt shared that he plans to play with his kids more and think positive with medications.   Sharyn Lull 05/19/2012, 12:12 PM
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
## 2012-05-19 NOTE — Progress Notes (Signed)
Received call From assessment RN from [facility] informing of acceptance from [place] [facility] to [facility] to bed 307/1.  Voluntary admission and consent form for treatment, UR form, release of information and assessment department checklist was obtain and fax to [facility] assessment department.
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
## 2012-05-20 DIAGNOSIS — F1310 Sedative, hypnotic or anxiolytic abuse, uncomplicated: Secondary | ICD-10-CM

## 2012-05-20 DIAGNOSIS — F1110 Opioid abuse, uncomplicated: Secondary | ICD-10-CM | POA: Diagnosis present

## 2012-05-20 NOTE — Progress Notes (Signed)
Recreation Therapy Notes  Date: 03.27.2014 Time: 3:00pm Location: [address] Day Room      Group Topic/Focus: Leisure Education  Participation Level: Did not attend  [employer], LRT/CTRS  Jearl Klinefelter 05/20/2012 4:27 PM

## 2012-05-20 NOTE — Progress Notes (Signed)
[facility] LCSW Aftercare Discharge Planning Group Note  05/20/2012 8:45 AM  Participation Quality:  Did Not Attend  Marcus [name]

## 2012-05-20 NOTE — Progress Notes (Signed)
Patient ID: Marcus [name], male   DOB: [birth year], 42 y.o.   MRN: [id number] He has been in bed this AM except for meals and medications in the AM. After lunch he he went to bed but did go to group when it was time to go.  He requested and received prn medication for pain at lunch saying that  He felt worse today then any other day.

## 2012-05-20 NOTE — Progress Notes (Signed)
[facility] LCSW Group Therapy - Late Entry  05/19/2012 1:15 PM  Type of Therapy: Group Therapy 1:15 to 2:30 PM  Participation Level: Patient did not attend   [name]

## 2012-05-20 NOTE — Progress Notes (Signed)
[facility] LCSW Group Therapy  05/20/2012 1:15 PM  Type of Therapy:  Group Therapy 1:15 top 2:30 PM  Participation Level:  Active  Participation Quality:  Appropriate  Affect:   Flat   Cognitive:  Oriented  Insight:  Developing/Improving  Engagement in Therapy:  Developing/I  Modes of Intervention: Discussion, Exploration, Rapport Building, Socialization and Support  Summary of Progress/Problems: Focus of group processing discussion was on balance in life; the components in life which have a negative influence on balance and the components which make for a more balanced life.  Patient shared how his addiction takes over his life, how he has lost friends probably every year to OD and spends time in house, with windows drawn using or sleeping it off.  Patient was able to share that he may never have another chance to get clean.   Marcus [name]

## 2012-05-20 NOTE — Progress Notes (Signed)
[facility] MD Progress Note  05/20/2012 4:18 PM Marcus [name]  MRN:  [id number]  Subjective:  "I'm fighting these withdrawal symptoms. I'm hot, then cold, then hot, turn around, cold again. I can't stay awake.  The social worker is finding for me a place to go for treatment after here. I feel like I Marcus't want to be around people".  Diagnosis:   Axis I: Opiate abuse, continuous, Benzodiazepine abuse, continuous Axis II: Deferred Axis III:  Past Medical History  Diagnosis Date  . Chronic back pain    Axis IV: Substance abuse issues Axis V: 41-50 serious symptoms  ADL's:  Intact  Sleep: Good  Appetite:  Good  Suicidal Ideation:  Plan:  Denies Intent:  Denies Means:  Denies Homicidal Ideation:  Plan:  Denies Intent:  denies Means:  Denies AEB (as evidenced by):  Psychiatric Specialty Exam: Review of Systems  Constitutional: Negative.   HENT: Negative.   Eyes: Negative.   Respiratory: Negative.   Cardiovascular: Negative.   Gastrointestinal: Negative.   Genitourinary: Negative.   Musculoskeletal: Positive for myalgias and back pain.  Skin: Negative.   Neurological: Positive for tremors.  Endo/Heme/Allergies: Negative.   Psychiatric/Behavioral: Positive for depression and substance abuse. Negative for suicidal ideas, hallucinations and memory loss. The patient is nervous/anxious and has insomnia.     Blood pressure 111/74, pulse 93, temperature 97.6 F (36.4 C), temperature source Oral, resp. rate 20, height 5' 10.5" (1.791 m), weight 92.987 kg (205 lb).Body mass index is 28.99 kg/(m^2).  General Appearance: Casual  Eye Contact::  Fair  Speech:  Clear and Coherent  Volume:  Normal  Mood:  Depressed  Affect:  Flat  Thought Process:  Coherent  Orientation:  Full (Time, Place, and Person)  Thought Content:  Rumination  Suicidal Thoughts:  No  Homicidal Thoughts:  No  Memory:  Immediate;   Good Recent;   Good Remote;   Good  Judgement:  Fair  Insight:  Fair   Psychomotor Activity:  Tremor and anxiety  Concentration:  Fair  Recall:  Good  Akathisia:  No  Handed:  Right  AIMS (if indicated):     Assets:  Desire for Improvement  Sleep:  Number of Hours: 6.75   Current Medications: Current Facility-Administered Medications  Medication Dose Route Frequency Provider Last Rate Last Dose  . acetaminophen (TYLENOL) tablet 650 mg  650 mg Oral Q6H PRN Kerry Hough, PA-C      . alum & mag hydroxide-simeth (MAALOX/MYLANTA) 200-200-20 MG/5ML suspension 30 mL  30 mL Oral Q4H PRN Kerry Hough, PA-C      . cloNIDine (CATAPRES) tablet 0.1 mg  0.1 mg Oral QID Kerry Hough, PA-C   0.1 mg at 05/20/12 1212   Followed by  . [START ON 05/21/2012] cloNIDine (CATAPRES) tablet 0.1 mg  0.1 mg Oral BH-qamhs Spencer E Simon, PA-C       Followed by  . [START ON 05/23/2012] cloNIDine (CATAPRES) tablet 0.1 mg  0.1 mg Oral QAC breakfast Kerry Hough, PA-C      . dicyclomine (BENTYL) tablet 20 mg  20 mg Oral Q6H PRN Kerry Hough, PA-C      . FLUoxetine (PROZAC) capsule 20 mg  20 mg Oral Daily Sanjuana Kava, NP   20 mg at 05/20/12 0831  . gabapentin (NEURONTIN) capsule 300 mg  300 mg Oral TID Sanjuana Kava, NP   300 mg at 05/20/12 1212  . hydrOXYzine (ATARAX/VISTARIL) tablet 25 mg  25 mg  Oral Q6H PRN Kerry Hough, PA-C   25 mg at 05/19/12 2153  . ibuprofen (ADVIL,MOTRIN) tablet 600 mg  600 mg Oral Q6H PRN Kerry Hough, PA-C   600 mg at 05/20/12 1216  . lamoTRIgine (LAMICTAL) tablet 25 mg  25 mg Oral BID Sanjuana Kava, NP   25 mg at 05/20/12 0831  . loperamide (IMODIUM) capsule 2-4 mg  2-4 mg Oral PRN Kerry Hough, PA-C   4 mg at 05/19/12 1205  . magnesium hydroxide (MILK OF MAGNESIA) suspension 30 mL  30 mL Oral Daily PRN Kerry Hough, PA-C      . methocarbamol (ROBAXIN) tablet 500 mg  500 mg Oral Q8H PRN Kerry Hough, PA-C   500 mg at 05/20/12 1216  . naproxen (NAPROSYN) tablet 500 mg  500 mg Oral BID PRN Kerry Hough, PA-C      . nicotine  polacrilex (NICORETTE) gum 2 mg  2 mg Oral PRN Kerry Hough, PA-C   2 mg at 05/20/12 1259  . ondansetron (ZOFRAN-ODT) disintegrating tablet 4 mg  4 mg Oral Q6H PRN Kerry Hough, PA-C      . traZODone (DESYREL) tablet 50 mg  50 mg Oral QHS,MR X 1 Kerry Hough, PA-C        Lab Results:  Results for orders placed during the hospital encounter of 05/18/12 (from the past 48 hour(s))  ACETAMINOPHEN LEVEL     Status: None   Collection Time    05/18/12  6:04 PM      Result Value Range   Acetaminophen (Tylenol), Serum <15.0  10 - 30 ug/mL   Comment:            THERAPEUTIC CONCENTRATIONS VARY     SIGNIFICANTLY. A RANGE OF 10-30     ug/mL MAY BE AN EFFECTIVE     CONCENTRATION FOR MANY PATIENTS.     HOWEVER, SOME ARE BEST TREATED     AT CONCENTRATIONS OUTSIDE THIS     RANGE.     ACETAMINOPHEN CONCENTRATIONS     >150 ug/mL AT 4 HOURS AFTER     INGESTION AND >50 ug/mL AT 12     HOURS AFTER INGESTION ARE     OFTEN ASSOCIATED WITH TOXIC     REACTIONS.  CBC     Status: None   Collection Time    05/18/12  6:04 PM      Result Value Range   WBC 6.8  4.0 - 10.5 K/uL   RBC 5.03  4.22 - 5.81 MIL/uL   Hemoglobin 15.1  13.0 - 17.0 g/dL   HCT 16.1  09.6 - 04.5 %   MCV 85.5  78.0 - 100.0 fL   MCH 30.0  26.0 - 34.0 pg   MCHC 35.1  30.0 - 36.0 g/dL   RDW 40.9  81.1 - 91.4 %   Platelets 275  150 - 400 K/uL  COMPREHENSIVE METABOLIC PANEL     Status: Abnormal   Collection Time    05/18/12  6:04 PM      Result Value Range   Sodium 138  135 - 145 mEq/L   Potassium 4.2  3.5 - 5.1 mEq/L   Chloride 99  96 - 112 mEq/L   CO2 31  19 - 32 mEq/L   Glucose, Bld 118 (*) 70 - 99 mg/dL   BUN 10  6 - 23 mg/dL   Creatinine, Ser 7.82  0.50 - 1.35 mg/dL   Calcium 9.2  8.4 - 10.5 mg/dL   Total Protein 8.0  6.0 - 8.3 g/dL   Albumin 4.0  3.5 - 5.2 g/dL   AST 16  0 - 37 U/L   ALT 26  0 - 53 U/L   Alkaline Phosphatase 95  39 - 117 U/L   Total Bilirubin 0.1 (*) 0.3 - 1.2 mg/dL   GFR calc non Af Amer >90   >90 mL/min   GFR calc Af Amer >90  >90 mL/min   Comment:            The eGFR has been calculated     using the CKD EPI equation.     This calculation has not been     validated in all clinical     situations.     eGFR's persistently     <90 mL/min signify     possible Chronic Kidney Disease.  ETHANOL     Status: None   Collection Time    05/18/12  6:04 PM      Result Value Range   Alcohol, Ethyl (B) <11  0 - 11 mg/dL   Comment:            LOWEST DETECTABLE LIMIT FOR     SERUM ALCOHOL IS 11 mg/dL     FOR MEDICAL PURPOSES ONLY  SALICYLATE LEVEL     Status: Abnormal   Collection Time    05/18/12  6:04 PM      Result Value Range   Salicylate Lvl <2.0 (*) 2.8 - 20.0 mg/dL  URINE RAPID DRUG SCREEN (HOSP PERFORMED)     Status: Abnormal   Collection Time    05/18/12  6:24 PM      Result Value Range   Opiates POSITIVE (*) NONE DETECTED   Cocaine NONE DETECTED  NONE DETECTED   Benzodiazepines POSITIVE (*) NONE DETECTED   Amphetamines NONE DETECTED  NONE DETECTED   Tetrahydrocannabinol NONE DETECTED  NONE DETECTED   Barbiturates NONE DETECTED  NONE DETECTED   Comment:            DRUG SCREEN FOR MEDICAL PURPOSES     ONLY.  IF CONFIRMATION IS NEEDED     FOR ANY PURPOSE, NOTIFY LAB     WITHIN 5 DAYS.                LOWEST DETECTABLE LIMITS     FOR URINE DRUG SCREEN     Drug Class       Cutoff (ng/mL)     Amphetamine      1000     Barbiturate      200     Benzodiazepine   200     Tricyclics       300     Opiates          300     Cocaine          300     THC              50    Physical Findings: AIMS: Facial and Oral Movements Muscles of Facial Expression: None, normal Lips and Perioral Area: None, normal Jaw: None, normal Tongue: None, normal,Extremity Movements Upper (arms, wrists, hands, fingers): None, normal Lower (legs, knees, ankles, toes): None, normal, Trunk Movements Neck, shoulders, hips: None, normal, Overall Severity Severity of abnormal movements (highest  score from questions above): None, normal Incapacitation due to abnormal movements: None, normal Patient's awareness of abnormal movements (rate only patient's report): No  Awareness, Dental Status Current problems with teeth and/or dentures?: No Does patient usually wear dentures?: No  CIWA:  CIWA-Ar Total: 4 COWS:  COWS Total Score: 1  Treatment Plan Summary: Daily contact with patient to assess and evaluate symptoms and progress in treatment Medication management  Plan: Supportive approach/coping skills/relapse prevention. Encouraged out of room, participation in group sessions and application of coping skills when distressed. Will continue to monitor response to/adverse effects of medications in use to assure effectiveness. Continue to monitor mood, behavior and interaction with staff and other patients. Continue current plan of care.  Medical Decision Making Problem Points:  Established problem, stable/improving (1), Review of last therapy session (1) and Review of psycho-social stressors (1) Data Points:  Review of medication regiment & side effects (2) Review of new medications or change in dosage (2)  I certify that inpatient services furnished can reasonably be expected to improve the patient's condition.   Armandina Stammer I 05/20/2012, 4:18 PM

## 2012-05-21 MED ORDER — RISPERIDONE 1 MG PO TABS
1.0000 mg | ORAL_TABLET | Freq: Every day | ORAL | Status: DC
  Administered 2012-05-21: 1 mg via ORAL
  Filled 2012-05-21 (×3): qty 1
  Filled 2012-05-21: qty 14

## 2012-05-21 MED ORDER — GABAPENTIN 400 MG PO CAPS
400.0000 mg | ORAL_CAPSULE | Freq: Three times a day (TID) | ORAL | Status: DC
  Administered 2012-05-21 – 2012-05-24 (×9): 400 mg via ORAL
  Filled 2012-05-21 (×2): qty 1
  Filled 2012-05-21: qty 42
  Filled 2012-05-21 (×5): qty 1
  Filled 2012-05-21: qty 42
  Filled 2012-05-21 (×3): qty 1
  Filled 2012-05-21: qty 42

## 2012-05-21 MED ORDER — GABAPENTIN 400 MG PO CAPS
ORAL_CAPSULE | ORAL | Status: AC
  Filled 2012-05-21: qty 1

## 2012-05-21 MED ORDER — RISPERIDONE 1 MG PO TABS
ORAL_TABLET | ORAL | Status: AC
  Filled 2012-05-21: qty 1

## 2012-05-21 MED ORDER — LAMOTRIGINE 25 MG PO TABS
50.0000 mg | ORAL_TABLET | Freq: Two times a day (BID) | ORAL | Status: DC
  Administered 2012-05-21 – 2012-05-24 (×6): 50 mg via ORAL
  Filled 2012-05-21 (×4): qty 2
  Filled 2012-05-21: qty 56
  Filled 2012-05-21: qty 2
  Filled 2012-05-21: qty 56
  Filled 2012-05-21 (×3): qty 2
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
## 2012-05-21 NOTE — Progress Notes (Signed)
Patient ID: Marcus [name], male   DOB: 1970/03/24, 42 y.o.   MRN: [id number] [facility] MD Progress Note  05/21/2012 3:58 PM Marcus [name]  MRN:  [id number]  Subjective:  "I'm feeling very ill. My mood is bad.  I'm pissed off for being here. I feel like I'm locked up. I need something to help my anxiety, something for my mood. I stay up all night. I can't sleep. I can't take certain antipsychotropic medicines. They give me restless leg syndrome at night. Clonazepam, xanax, lorazepam work for me. Why can't I have them".  O: Reported from staff had indicated that Marcus [name] does not attend and or participate in group sessions and or AA/NA meetings being offered on this unit.   Diagnosis:   Axis I: Opiate abuse, continuous, Benzodiazepine abuse, continuous Axis II: Deferred Axis III:  Past Medical History  Diagnosis Date  . Chronic back pain    Axis IV: Substance abuse issues Axis V: 41-50 serious symptoms  ADL's:  Intact  Sleep: Fair  Appetite:  Good  Suicidal Ideation:  Plan:  Denies Intent:  Denies Means:  Denies Homicidal Ideation:  Plan:  Denies Intent:  denies Means:  Denies AEB (as evidenced by):  Psychiatric Specialty Exam: Review of Systems  Constitutional: Negative.   HENT: Negative.   Eyes: Negative.   Respiratory: Negative.   Cardiovascular: Negative.   Gastrointestinal: Negative.   Genitourinary: Negative.   Musculoskeletal: Positive for myalgias and back pain.  Skin: Negative.   Neurological: Positive for tremors.  Endo/Heme/Allergies: Negative.   Psychiatric/Behavioral: Positive for depression and substance abuse. Negative for suicidal ideas, hallucinations and memory loss. The patient is nervous/anxious and has insomnia.     Blood pressure 107/75, pulse 72, temperature 96.6 F (35.9 C), temperature source Oral, resp. rate 18, height 5' 10.5" (1.791 m), weight 92.987 kg (205 lb).Body mass index is 28.99 kg/(m^2).  General Appearance: Casual   Eye Contact::  Fair  Speech:  Clear and Coherent  Volume:  Normal  Mood:  Depressed, angry, "ill feeling, pissed off"  Affect:  Flat  Thought Process:  Coherent  Orientation:  Full (Time, Place, and Person)  Thought Content:  Rumination  Suicidal Thoughts:  No  Homicidal Thoughts:  No  Memory:  Immediate;   Good Recent;   Good Remote;   Good  Judgement:  Fair  Insight:  Fair  Psychomotor Activity:  Tremor and anxiety  Concentration:  Fair  Recall:  Good  Akathisia:  No  Handed:  Right  AIMS (if indicated):     Assets:  Desire for Improvement  Sleep:  Number of Hours: 5.5   Current Medications: Current Facility-Administered Medications  Medication Dose Route Frequency Provider Last Rate Last Dose  . acetaminophen (TYLENOL) tablet 650 mg  650 mg Oral Q6H PRN Kerry Hough, PA-C      . alum & mag hydroxide-simeth (MAALOX/MYLANTA) 200-200-20 MG/5ML suspension 30 mL  30 mL Oral Q4H PRN Kerry Hough, PA-C      . cloNIDine (CATAPRES) tablet 0.1 mg  0.1 mg Oral BH-qamhs Spencer E Simon, PA-C   0.1 mg at 05/21/12 0805   Followed by  . [START ON 05/23/2012] cloNIDine (CATAPRES) tablet 0.1 mg  0.1 mg Oral QAC breakfast Kerry Hough, PA-C      . dicyclomine (BENTYL) tablet 20 mg  20 mg Oral Q6H PRN Kerry Hough, PA-C      . FLUoxetine (PROZAC) capsule 20 mg  20 mg Oral Daily Marcus Kindred  I Tiarra Anastacio, NP   20 mg at 05/21/12 0804  . gabapentin (NEURONTIN) capsule 400 mg  400 mg Oral TID Sanjuana Kava, NP      . hydrOXYzine (ATARAX/VISTARIL) tablet 25 mg  25 mg Oral Q6H PRN Kerry Hough, PA-C   25 mg at 05/21/12 0651  . ibuprofen (ADVIL,MOTRIN) tablet 600 mg  600 mg Oral Q6H PRN Kerry Hough, PA-C   600 mg at 05/20/12 2157  . lamoTRIgine (LAMICTAL) tablet 50 mg  50 mg Oral BID Sanjuana Kava, NP      . loperamide (IMODIUM) capsule 2-4 mg  2-4 mg Oral PRN Kerry Hough, PA-C   4 mg at 05/19/12 1205  . magnesium hydroxide (MILK OF MAGNESIA) suspension 30 mL  30 mL Oral Daily PRN Kerry Hough, PA-C      . methocarbamol (ROBAXIN) tablet 500 mg  500 mg Oral Q8H PRN Kerry Hough, PA-C   500 mg at 05/20/12 2157  . naproxen (NAPROSYN) tablet 500 mg  500 mg Oral BID PRN Kerry Hough, PA-C      . nicotine polacrilex (NICORETTE) gum 2 mg  2 mg Oral PRN Kerry Hough, PA-C   2 mg at 05/21/12 4540  . ondansetron (ZOFRAN-ODT) disintegrating tablet 4 mg  4 mg Oral Q6H PRN Kerry Hough, PA-C      . risperiDONE (RISPERDAL) tablet 1 mg  1 mg Oral QAC supper Sanjuana Kava, NP      . traZODone (DESYREL) tablet 50 mg  50 mg Oral QHS,MR X 1 Kerry Hough, PA-C        Lab Results:  No results found for this or any previous visit (from the past 48 hour(s)).  Physical Findings: AIMS: Facial and Oral Movements Muscles of Facial Expression: None, normal Lips and Perioral Area: None, normal Jaw: None, normal Tongue: None, normal,Extremity Movements Upper (arms, wrists, hands, fingers): None, normal Lower (legs, knees, ankles, toes): None, normal, Trunk Movements Neck, shoulders, hips: None, normal, Overall Severity Severity of abnormal movements (highest score from questions above): None, normal Incapacitation due to abnormal movements: None, normal Patient's awareness of abnormal movements (rate only patient's report): No Awareness, Dental Status Current problems with teeth and/or dentures?: No Does patient usually wear dentures?: No  CIWA:  CIWA-Ar Total: 5 COWS:  COWS Total Score: 0  Treatment Plan Summary: Daily contact with patient to assess and evaluate symptoms and progress in treatment Medication management  Plan: Supportive approach/coping skills/relapse prevention. Initiate Risperdal 1 mg daily at 5:00 pm for mood control. Increase Lamictal from 25 mg to 50 mg daily. Increased Neurontin from 300 mg to 400 mg tid. Encouraged out of room, participation in group sessions and application of coping skills when distressed. Will continue to monitor response to/adverse  effects of medications in use to assure effectiveness. Continue to monitor mood, behavior and interaction with staff and other patients. Continue current plan of care.  Medical Decision Making Problem Points:  Established problem, stable/improving (1), Review of last therapy session (1) and Review of psycho-social stressors (1) Data Points:  Review of medication regiment & side effects (2) Review of new medications or change in dosage (2)  I certify that inpatient services furnished can reasonably be expected to improve the patient's condition.   Armandina Stammer I 05/21/2012, 3:58 PM

## 2012-05-21 NOTE — Progress Notes (Signed)
Pt is observed resting in bed with eyes closed. RR WNL, even and unlabored. Level III obs in place for safety and pt remains safe. Lawrence Marseilles

## 2012-05-21 NOTE — Progress Notes (Signed)
Adult Psychoeducational Group Note  Date:  05/21/2012 Time:  12:08 PM  Group Topic/Focus:  Relapse Prevention Planning:   The focus of this group is to define relapse and discuss the need for planning to combat relapse.  Participation Level:  Did Not Attend   Additional Comments:  Pt. Didn't attend group.   Bing Plume D 05/21/2012, 12:08 PM

## 2012-05-21 NOTE — Progress Notes (Signed)
Provided patient with application for [facility] treatment program after he agreed it may be a good discharge option as they offer replacement drug therapy and offer housing.   Patient came to neither group today; girlfriend called and requested referral to [facility]. CSW had already provided application earlier today.  Carney Bern, LCSWA  05/21/2012

## 2012-05-21 NOTE — Progress Notes (Signed)
Patient did not attend the evening karaoke group. Pt was told about group but remained in his bed reporting withdrawal symptoms.

## 2012-05-21 NOTE — Progress Notes (Signed)
[facility] LCSW Aftercare Discharge Planning Group Note  05/21/2012 8:45 AM  Participation Quality: Did Not Attend  Carney Bern, LCSWA

## 2012-05-21 NOTE — Progress Notes (Signed)
[facility] LCSW Group Therapy  05/21/2012 1:15 PM  Type of Therapy:  Group Therapy 1:15 to 2:30 PM  Participation Level: Did Not Attend   [name]

## 2012-05-22 DIAGNOSIS — F1110 Opioid abuse, uncomplicated: Secondary | ICD-10-CM

## 2012-05-22 NOTE — Progress Notes (Signed)
Adult Psychoeducational Group Note  Date:  05/22/2012 Time:  0900  Group Topic/Focus:  Self ivnentory sheet  Participation Level:  Active  Participation Quality:  Appropriate  Affect:  Appropriate  Cognitive:  Appropriate  Insight: Appropriate  Engagement in Group:  Engaged  Modes of Intervention:  Clarification, Exploration and Problem-solving  Additional Comments:  participated  Roselee Culver 05/22/2012, 1:07 PM

## 2012-05-22 NOTE — Progress Notes (Signed)
Patient ID: Marcus [name], male   DOB: Nov 14, 1970, 41 y.o.   MRN: [id number] D. The patient spent the evening resting in bed with eyes closed. Stated that the medication they gave him "knocked him out" and he could not get out of bed to attend the AA/NA group. A. Met with patient in his room. Assessed for symptoms of withdrawal. Encouraged to attend evening group. HS clonidine held due to BP being to low. Refused Trazodone as it gives him restless leg syndrome. R. Denied symptoms of withdrawal. BP was rechecked at 2345 and clonidine administered.

## 2012-05-22 NOTE — Progress Notes (Signed)
[facility] MD Progress Note  05/22/2012 3:38 PM Marcus [name]  MRN:  [id number] Subjective:  Marcus [name] is asleep in his bed this afternoon, and is awakened with some verbal prompts. When asked how he is doing he states "I'm really tired." He reports that his mood is improved. He denies any cravings or withdrawal. He denies any suicidal or homicidal ideation. He denies any auditory or visual hallucinations. He reports that he has been sleeping well, and his appetite is good.  Diagnosis:   Axis I: Opiate abuse, Benzodaizepine abuse Axis II: Deferred Axis III:  Past Medical History  Diagnosis Date  . Chronic back pain     ADL's:  Intact  Sleep: Good  Appetite:  Good  Suicidal Ideation:  Patient denies any thought, plan, or intent Homicidal Ideation:  Patient denies any thought, plan, or intent AEB (as evidenced by):  Psychiatric Specialty Exam: Review of Systems  Constitutional: Negative.   HENT: Negative.   Eyes: Negative.   Respiratory: Negative.   Cardiovascular: Negative.   Gastrointestinal: Negative.   Genitourinary: Negative.   Musculoskeletal: Negative.   Skin: Negative.   Neurological: Negative.   Endo/Heme/Allergies: Negative.   Psychiatric/Behavioral: Negative.     Blood pressure 109/77, pulse 78, temperature 97.5 F (36.4 C), temperature source Oral, resp. rate 18, height 5' 10.5" (1.791 m), weight 92.987 kg (205 lb).Body mass index is 28.99 kg/(m^2).  General Appearance: Casual  Eye Contact::  Fair  Speech:  Clear and Coherent  Volume:  Normal  Mood:  Dysphoric  Affect:  Congruent  Thought Process:  Logical  Orientation:  Full (Time, Place, and Person)  Thought Content:  WDL  Suicidal Thoughts:  No  Homicidal Thoughts:  No  Memory:  Immediate;   Good Recent;   Good Remote;   Good  Judgement:  Fair  Insight:  Fair  Psychomotor Activity:  Normal  Concentration:  Good  Recall:  Good  Akathisia:  No  Handed:  Right  AIMS (if indicated):     Assets:   Communication Skills Physical Health  Sleep:  Number of Hours: 1.75   Current Medications: Current Facility-Administered Medications  Medication Dose Route Frequency Provider Last Rate Last Dose  . acetaminophen (TYLENOL) tablet 650 mg  650 mg Oral Q6H PRN Kerry Hough, PA-C      . alum & mag hydroxide-simeth (MAALOX/MYLANTA) 200-200-20 MG/5ML suspension 30 mL  30 mL Oral Q4H PRN Kerry Hough, PA-C      . cloNIDine (CATAPRES) tablet 0.1 mg  0.1 mg Oral BH-qamhs Spencer E Simon, PA-C   0.1 mg at 05/22/12 0825   Followed by  . [START ON 05/23/2012] cloNIDine (CATAPRES) tablet 0.1 mg  0.1 mg Oral QAC breakfast Kerry Hough, PA-C      . dicyclomine (BENTYL) tablet 20 mg  20 mg Oral Q6H PRN Kerry Hough, PA-C      . FLUoxetine (PROZAC) capsule 20 mg  20 mg Oral Daily Sanjuana Kava, NP   20 mg at 05/22/12 0826  . gabapentin (NEURONTIN) capsule 400 mg  400 mg Oral TID Sanjuana Kava, NP   400 mg at 05/22/12 1159  . hydrOXYzine (ATARAX/VISTARIL) tablet 25 mg  25 mg Oral Q6H PRN Kerry Hough, PA-C   25 mg at 05/22/12 1158  . ibuprofen (ADVIL,MOTRIN) tablet 600 mg  600 mg Oral Q6H PRN Kerry Hough, PA-C   600 mg at 05/22/12 0830  . lamoTRIgine (LAMICTAL) tablet 50 mg  50 mg Oral  BID Sanjuana Kava, NP   50 mg at 05/22/12 0827  . loperamide (IMODIUM) capsule 2-4 mg  2-4 mg Oral PRN Kerry Hough, PA-C   4 mg at 05/19/12 1205  . magnesium hydroxide (MILK OF MAGNESIA) suspension 30 mL  30 mL Oral Daily PRN Kerry Hough, PA-C      . methocarbamol (ROBAXIN) tablet 500 mg  500 mg Oral Q8H PRN Kerry Hough, PA-C   500 mg at 05/20/12 2157  . naproxen (NAPROSYN) tablet 500 mg  500 mg Oral BID PRN Kerry Hough, PA-C   500 mg at 05/22/12 1157  . nicotine polacrilex (NICORETTE) gum 2 mg  2 mg Oral PRN Kerry Hough, PA-C   2 mg at 05/21/12 1619  . ondansetron (ZOFRAN-ODT) disintegrating tablet 4 mg  4 mg Oral Q6H PRN Kerry Hough, PA-C      . risperiDONE (RISPERDAL) tablet 1 mg  1 mg  Oral QAC supper Sanjuana Kava, NP   1 mg at 05/21/12 1618  . traZODone (DESYREL) tablet 50 mg  50 mg Oral QHS,MR X 1 Kerry Hough, PA-C        Lab Results: No results found for this or any previous visit (from the past 48 hour(s)).  Physical Findings: AIMS: Facial and Oral Movements Muscles of Facial Expression: None, normal Lips and Perioral Area: None, normal Jaw: None, normal Tongue: None, normal,Extremity Movements Upper (arms, wrists, hands, fingers): None, normal Lower (legs, knees, ankles, toes): None, normal, Trunk Movements Neck, shoulders, hips: None, normal, Overall Severity Severity of abnormal movements (highest score from questions above): None, normal Incapacitation due to abnormal movements: None, normal Patient's awareness of abnormal movements (rate only patient's report): No Awareness, Dental Status Current problems with teeth and/or dentures?: No Does patient usually wear dentures?: No  CIWA:  CIWA-Ar Total: 4 COWS:  COWS Total Score: 0  Treatment Plan Summary: Daily contact with patient to assess and evaluate symptoms and progress in treatment Medication management  Plan:  Medical Decision Making Problem Points:  Established problem, stable/improving (1), Review of last therapy session (1) and Review of psycho-social stressors (1) Data Points:  Review and summation of old records (2) Review of medication regiment & side effects (2)  I certify that inpatient services furnished can reasonably be expected to improve the patient's condition.   Tziporah Knoke 05/22/2012, 3:38 PM

## 2012-05-22 NOTE — Progress Notes (Signed)
Patient ID: Marcus [name], male   DOB: 10/13/1970, 42 y.o.   MRN: [id number] D: "I am in pain." A: Pt. denies lethality and A/V/H's and is defensive about his use and abuse of substances for his chronic pain relief.  Pt. states he had run out of money to buy his meds, but he could obtain street drugs more readily.  Pt. received medication as ordered and he took his Am late, due to his choice of remaining in bed late today. R: Will continue to monitor for changes and to give Pt. his PRNs as ordered.

## 2012-05-22 NOTE — Progress Notes (Signed)
I certify that inpatient services furnished can reasonably be expected to improve the patient's condition. Macyn Shropshire, MD, [facility]  

## 2012-05-22 NOTE — Clinical Social Work Note (Signed)
[facility] Group Notes:  (Clinical Social Work)  05/22/2012     10-11AM  Summary of Progress/Problems:   The main focus of today's process group was for the patient to identify ways in which they have in the past sabotaged their own recovery. Motivational Interviewing was utilized to ask the group members what they get out of their substance use, and what they want to change.  The Stages of Change were explained, and members identified where they currently are with regard to stages of change.  The patient expressed that he has self-sabotaged in the past by not going through with any of the arranged follow-up.  He uses because he is trying to find the "happy" he experienced the first time he used.  He wants to get his family back, the trust of his mother, father, fiancee, and kids.  He stated he is in relapse stage, with contemplation ongoing.  He wants to go to a 90-day program after discharge.  Type of Therapy:  Group Therapy - Process   Participation Level:  Active  Participation Quality:  Appropriate, Attentive, Sharing and Supportive  Affect:  Blunted  Cognitive:  Alert, Appropriate and Oriented  Insight:  Engaged  Engagement in Therapy:  Engaged  Modes of Intervention:  Education, Teacher, English as a foreign language, Exploration, Discussion, Motivational Interviewing   Ambrose Mantle, LCSW 05/22/2012, 12:23 PM

## 2012-05-22 NOTE — Progress Notes (Signed)
Adult Psychoeducational Group Note  Date:  05/22/2012 Time:  3:00PM  Group Topic/Focus:  Goals Group:   The focus of this group is to help patients establish daily goals to achieve during treatment and discuss how the patient can incorporate goal setting into their daily lives to aide in recovery.  Participation Level:  Minimal  Participation Quality:  Drowsy, Inattentive and Resistant  Affect:  Blunted, Defensive, Flat and Lethargic  Cognitive:  Lacking  Insight: Limited  Engagement in Group:  Defensive, Lacking, Limited and Resistant  Modes of Intervention:  Clarification, Discussion, Education, Exploration and Problem-solving  Additional Comments: Pt. slept through most of the group.  Marcus [name] 05/22/2012, 9:09 PM

## 2012-05-23 MED ORDER — FLUOXETINE HCL 20 MG PO CAPS
40.0000 mg | ORAL_CAPSULE | Freq: Every day | ORAL | Status: DC
  Administered 2012-05-24: 40 mg via ORAL
  Filled 2012-05-23: qty 28
  Filled 2012-05-23: qty 2

## 2012-05-23 MED ORDER — HYDROXYZINE HCL 50 MG PO TABS
50.0000 mg | ORAL_TABLET | Freq: Every evening | ORAL | Status: DC | PRN
  Administered 2012-05-23 – 2012-05-24 (×2): 50 mg via ORAL
  Filled 2012-05-23: qty 1

## 2012-05-23 NOTE — Progress Notes (Signed)
[facility] MD Progress Note  05/23/2012 12:13 PM Marcus [name]  MRN:  [id number] Subjective:  Marcus [name] he was anxious to be seen by this provider. He is complaining of poor energy, and an inability to stay awake during the day. He reports that he has been given Provigil in the past, and will like to take that again. He denies is having any withdrawal symptoms or cravings. He reports that he has no real depression or anxiety. He does ask that his Prozac be increased as he has been on higher dose in the past, and finds that it gives him more energy. He denies any suicidal or homicidal ideation. He denies any auditory or visual hallucinations. He does endorse some back pain. He plans to go to [facility] after discharge from this facility for extended substance abuse treatment.  Diagnosis:   Axis I: Opiate abuse, Benzodaizepine abuse Axis II: Deferred Axis III:  Past Medical History  Diagnosis Date  . Chronic back pain     ADL's:  Intact  Sleep: Good  Appetite:  Fair  Suicidal Ideation:  Patient denies any thought, plan,or intent Homicidal Ideation:  Patient denies any thought, plan, or intent AEB (as evidenced by):  Psychiatric Specialty Exam: Review of Systems  Constitutional: Negative.   HENT: Negative.   Eyes: Negative.   Respiratory: Negative.   Gastrointestinal: Negative.   Genitourinary: Negative.   Musculoskeletal: Positive for back pain.  Skin: Negative.   Neurological: Negative.   Endo/Heme/Allergies: Negative.   Psychiatric/Behavioral: Positive for substance abuse. Negative for depression, suicidal ideas and hallucinations. The patient is nervous/anxious. The patient does not have insomnia.     Blood pressure 102/66, pulse 59, temperature 97.4 F (36.3 C), temperature source Oral, resp. rate 18, height 5' 10.5" (1.791 m), weight 92.987 kg (205 lb).Body mass index is 28.99 kg/(m^2).  General Appearance: Casual  Eye Contact::  Good  Speech:  Clear and Coherent  Volume:  Normal   Mood:  Dysphoric  Affect:  Congruent  Thought Process:  Loose  Orientation:  Full (Time, Place, and Person)  Thought Content:  WDL  Suicidal Thoughts:  No  Homicidal Thoughts:  No  Memory:  Immediate;   Good Recent;   Good Remote;   Good  Judgement:  Fair  Insight:  Fair  Psychomotor Activity:  Normal  Concentration:  Good  Recall:  Good  Akathisia:  No  Handed:  Right  AIMS (if indicated):     Assets:  Communication Skills Desire for Improvement Social Support  Sleep:  Number of Hours: 6.25   Current Medications: Current Facility-Administered Medications  Medication Dose Route Frequency Provider Last Rate Last Dose  . acetaminophen (TYLENOL) tablet 650 mg  650 mg Oral Q6H PRN Kerry Hough, PA-C      . alum & mag hydroxide-simeth (MAALOX/MYLANTA) 200-200-20 MG/5ML suspension 30 mL  30 mL Oral Q4H PRN Kerry Hough, PA-C      . cloNIDine (CATAPRES) tablet 0.1 mg  0.1 mg Oral QAC breakfast Kerry Hough, PA-C   0.1 mg at 05/23/12 3086  . dicyclomine (BENTYL) tablet 20 mg  20 mg Oral Q6H PRN Kerry Hough, PA-C      . FLUoxetine (PROZAC) capsule 20 mg  20 mg Oral Daily Sanjuana Kava, NP   20 mg at 05/23/12 0806  . gabapentin (NEURONTIN) capsule 400 mg  400 mg Oral TID Sanjuana Kava, NP   400 mg at 05/23/12 1156  . hydrOXYzine (ATARAX/VISTARIL) tablet 25 mg  25 mg Oral Q6H PRN Kerry Hough, PA-C   25 mg at 05/23/12 4098  . ibuprofen (ADVIL,MOTRIN) tablet 600 mg  600 mg Oral Q6H PRN Kerry Hough, PA-C   600 mg at 05/22/12 2118  . lamoTRIgine (LAMICTAL) tablet 50 mg  50 mg Oral BID Sanjuana Kava, NP   50 mg at 05/23/12 0806  . loperamide (IMODIUM) capsule 2-4 mg  2-4 mg Oral PRN Kerry Hough, PA-C   4 mg at 05/19/12 1205  . magnesium hydroxide (MILK OF MAGNESIA) suspension 30 mL  30 mL Oral Daily PRN Kerry Hough, PA-C      . methocarbamol (ROBAXIN) tablet 500 mg  500 mg Oral Q8H PRN Kerry Hough, PA-C   500 mg at 05/20/12 2157  . naproxen (NAPROSYN) tablet  500 mg  500 mg Oral BID PRN Kerry Hough, PA-C   500 mg at 05/22/12 1157  . nicotine polacrilex (NICORETTE) gum 2 mg  2 mg Oral PRN Kerry Hough, PA-C   2 mg at 05/21/12 1619  . ondansetron (ZOFRAN-ODT) disintegrating tablet 4 mg  4 mg Oral Q6H PRN Kerry Hough, PA-C      . risperiDONE (RISPERDAL) tablet 1 mg  1 mg Oral QAC supper Sanjuana Kava, NP   1 mg at 05/21/12 1618  . traZODone (DESYREL) tablet 50 mg  50 mg Oral QHS,MR X 1 Kerry Hough, PA-C        Lab Results: No results found for this or any previous visit (from the past 48 hour(s)).  Physical Findings: AIMS: Facial and Oral Movements Muscles of Facial Expression: None, normal Lips and Perioral Area: None, normal Jaw: None, normal Tongue: None, normal,Extremity Movements Upper (arms, wrists, hands, fingers): None, normal Lower (legs, knees, ankles, toes): None, normal, Trunk Movements Neck, shoulders, hips: None, normal, Overall Severity Severity of abnormal movements (highest score from questions above): None, normal Incapacitation due to abnormal movements: None, normal Patient's awareness of abnormal movements (rate only patient's report): No Awareness, Dental Status Current problems with teeth and/or dentures?: No Does patient usually wear dentures?: No  CIWA:  CIWA-Ar Total: 5 COWS:  COWS Total Score: 2  Treatment Plan Summary: Daily contact with patient to assess and evaluate symptoms and progress in treatment Medication management  Plan: We will continue his current plan of care. We'll increase his Prozac to 40 mg daily. Medical Decision Making Problem Points:  Established problem, stable/improving (1), Review of last therapy session (1) and Review of psycho-social stressors (1) Data Points:  Review and summation of old records (2) Review of medication regiment & side effects (2) Review of new medications or change in dosage (2)  I certify that inpatient services furnished can reasonably be expected  to improve the patient's condition.   Hiilei Gerst 05/23/2012, 12:13 PM

## 2012-05-23 NOTE — Progress Notes (Signed)
D.  Pt pleasant on approach, still reports some back pain and anxiety.  Denies SI/HI/hallucinations at this time.  Positive for evening group.  Interacting appropriately within milieu.  A.  Support and encouragement offered, medications given as ordered for anxiety and back pain  R.  Pt remains safe on unit, will continue to monitor.

## 2012-05-23 NOTE — Progress Notes (Signed)
Pt reported feeling much better today, "completely detoxed". Pt stated that this was the first day he was able to remain awake and attend all groups but still felt like he had to fight to stay awake. Pt reports having trouble also remaining awake at home.  Pt requests that he could be put back on Provigil, states he was on it during his last stay and it helped him a lot.  Pt did not remain on this med because of the price.  Pt inquiring if there is another similar drug more affordable.

## 2012-05-23 NOTE — Progress Notes (Signed)
[facility] Group Notes:  (Nursing/MHT/Case Management/Adjunct)  Date:  05/22/2012 Time:  2100 Type of Therapy:  wrap up group  Participation Level:  Active  Participation Quality:  Appropriate and Attentive  Affect:  Appropriate  Cognitive:  Appropriate  Insight:  Appropriate and Good  Engagement in Group:  Engaged  Modes of Intervention:  Clarification, Education and Support  Summary of Progress/Problems:   Marcus [name] 05/23/2012, 2:20 AM

## 2012-05-23 NOTE — Progress Notes (Signed)
I certify that inpatient services furnished can reasonably be expected to improve the patient's condition. Danil Wedge, MD, [facility]  

## 2012-05-23 NOTE — Clinical Social Work Note (Signed)
[facility] Group Notes:  (Clinical Social Work)  05/23/2012  10:00-11:00AM  Summary of Progress/Problems:   The main focus of today's process group was to   identify the patient's current support system and decide on other supports that can be put in place to prevent future hospitalizations.  A handout was used to explain the 4 definitions/levels of support and to think about what support patient has given and received from others.  An emphasis was placed on using counselor, doctor, therapy groups, 12-step groups, and problem-specific support groups to expand supports. The patient expressed full understanding of the group content and all comments.  He was somewhat disturbed at another patient and talked little after that person joined the group.  Type of Therapy:  Process Group with Motivational Interviewing  Participation Level:  Active  Participation Quality:  Appropriate, Attentive and Sharing  Affect:  Blunted  Cognitive:  Appropriate  Insight:  Engaged  Engagement in Therapy:  Engaged  Modes of Intervention:  Clarification, Education, Limit-setting, Problem-solving, Socialization, Support and Processing, Exploration, Discussion, Role-Play   Ambrose Mantle, LCSW 05/23/2012, 12:58 PM

## 2012-05-23 NOTE — Progress Notes (Signed)
D:  Patient's self inventory sheet, patient has poor sleep, improving appetite, low energy level, improving attention span.  Rated depression and hopelessness #6, anxiety #10.  Has been experiencing cravings and agitation.  Denied SI.  Has experienced pain, headache in past 24 hours.  Pain goal today #5, worst pain #10.  After discharge, plans to take medications.  No problems taking meds after discharge. A:  Medications administered per MD orders.  Support and encouragement given throughout day. R:  Following treatment plan.  Denied SI and HI.  Contracts for safety.  Denied A/V hallucinations. Would like provigil or another medication that is less expensive to help him focus after discharge.

## 2012-05-23 NOTE — Progress Notes (Signed)
D.  Pt pleasant and appropriate on approach, denies complaints other than insomnia at this time.  Interacting appropriately within milieu.  Positive for evening AA group, unhappy with another Pt that interrupted during group.  A.  Support and encouragement offered  R.  Pt remains safe on unit, will continue to monitor.

## 2012-05-24 MED ORDER — FLUOXETINE HCL 40 MG PO CAPS: 40.0000 mg | ORAL_CAPSULE | Freq: Every day | ORAL | Status: DC

## 2012-05-24 MED ORDER — LAMOTRIGINE 25 MG PO TABS: 50.0000 mg | ORAL_TABLET | Freq: Two times a day (BID) | ORAL | Status: DC

## 2012-05-24 MED ORDER — GABAPENTIN 400 MG PO CAPS: 400.0000 mg | ORAL_CAPSULE | Freq: Three times a day (TID) | ORAL | Status: DC

## 2012-05-24 MED ORDER — RISPERIDONE 1 MG PO TABS: 1.0000 mg | ORAL_TABLET | Freq: Every day | ORAL | Status: DC

## 2012-05-24 MED ORDER — TRAZODONE HCL 50 MG PO TABS: 50.0000 mg | ORAL_TABLET | Freq: Every evening | ORAL | Status: DC | PRN

## 2012-05-24 MED ORDER — NALTREXONE HCL 50 MG PO TABS: 25.0000 mg | ORAL_TABLET | Freq: Every day | ORAL | Status: DC

## 2012-05-24 MED ORDER — NALTREXONE HCL 50 MG PO TABS
25.0000 mg | ORAL_TABLET | Freq: Every day | ORAL | Status: DC
  Administered 2012-05-24: 25 mg via ORAL
  Filled 2012-05-24: qty 7
  Filled 2012-05-24: qty 1

## 2012-05-24 NOTE — Progress Notes (Signed)
Patient ID: Marcus [name], male   DOB: 1971/01/03, 42 y.o.   MRN: [id number] He has been up and to groups today interacting with peers and staff. York Spaniel that he is ready to leave today. Self inventory: depression 3, Hopelessness 1, and he denies SI thoughts.

## 2012-05-24 NOTE — Tx Team (Signed)
Interdisciplinary Treatment Plan Update (Adult)  Date: 05/24/2012  Time Reviewed: 9:56 AM   Progress in Treatment: Attending groups: Yes Participating in groups: Yes Taking medication as prescribed:  Yes Tolerating medication:  Yes Family/Significant othe contact made: Yes Patient understands diagnosis: Yes Discussing patient identified problems/goals with staff: Yes Medical problems stabilized or resolved:  Yes Denies suicidal/homicidal ideation: Yes Patient has not harmed self or Others: Yes  New problem(s) identified: None Identified  Discharge Plan or Barriers:  CSW has referred patient to [facility] program yet we have yet to hear back.  CSW will provide [facility] with patient's contact information. If patient does not get in he is committed to following up with suboxone program in his area.   Additional comments: N/A  Reason for Continuation of Hospitalization: NA   Estimated length of stay: Discharge Today  For review of initial/current patient goals, please see plan of care.  Attendees: Patient:     Family:     Physician:  Geoffery Lyons 05/24/2012 9:56 AM   Nursing:   Roswell Miners, RN 05/24/2012 9:56 AM   Clinical Social Worker Ronda Fairly 05/24/2012 9:56 AM   Other:  Robbie Louis, RN 05/24/2012 9:56 AM   Other:  Serena Colonel, NP 05/24/2012 9:56 AM   Other:     Other:      Scribe for Treatment Team:   Carney Bern, LCSWA  05/24/2012 9:56 AM

## 2012-05-24 NOTE — Progress Notes (Signed)
Patient ID: Marcus [name], male   DOB: [birth year], 42 y.o.   MRN: [id number] He has been discharged home and he voiced understanding of discharge instruction and of the follow up plan. He was picked up by his mother. He denies thoughts of SI  And all belonging were taken home with him.

## 2012-05-24 NOTE — [facility] Suicide Risk Assessment (Signed)
Suicide Risk Assessment  Discharge Assessment     Demographic Factors:  Male and Caucasian  Mental Status Per Nursing Assessment::   On Admission:  NA  Current Mental Status by Physician: In full contact with reality. There are no suicidal ideas plans or intent. States that he has back surgery coming his way. He was doing well on Suboxone, but states that he was discharged from that program when he tested positive for marijuana. After he came off the Suboxone he went back to [name]. He started abusing the opioids again. He is upset with himself. States he has two kids he needs to take care of and using is not the way to do it    Loss Factors: Decline in physical health  Historical Factors: NA  Risk Reduction Factors:   Responsible for children under 104 years of age, Sense of responsibility to family and Living with another person, especially Marcus [name] relative  Continued Clinical Symptoms:  Alcohol/Substance Abuse/Dependencies  Cognitive Features That Contribute To Risk: None identified   Suicide Risk:  Minimal: No identifiable suicidal ideation.  Patients presenting with no risk factors but with morbid ruminations; may be classified as minimal risk based on the severity of the depressive symptoms  Discharge Diagnoses:   AXIS I:  Opioid Dependence, Unspecified Bipolar Disorder  AXIS II:  Deferred AXIS III:   Past Medical History  Diagnosis Date  . Chronic back pain    AXIS IV:  other psychosocial or environmental problems and problems with primary support group AXIS V:  61-70 mild symptoms  Plan Of Care/Follow-up recommendations:  Activity:  As tolerated Diet:  regular Pursue outpatient follow up Is patient on multiple antipsychotic therapies at discharge:  No   Has Patient had three or more failed trials of antipsychotic monotherapy by history:  No  Recommended Plan for Multiple Antipsychotic Therapies: N/Marcus [name]   Marcus Marcus [name] 05/24/2012, 12:49 PM
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
## 2012-05-24 NOTE — Progress Notes (Signed)
[facility] INPATIENT:  Family/Significant Other Suicide Prevention Education  Suicide Prevention Education:  Clinical research associate provided suicide prevention education directly to patient; conversation included risk factors, warning signs and resources to contact for help. Mobile crisis services explained and number given to pt on 05/24/12 before lunch.   [name] 05/25/2012, 9:12 AM

## 2012-05-25 NOTE — Progress Notes (Signed)
[facility] Adult Case Management Discharge Plan :  Will you be returning to the same living situation after discharge: No. Patient will be staying with Mother verses girlfriend until he gets into SA programming At discharge, do you have transportation home?:Yes,  Mother Do you have the ability to pay for your medications:Yes,  through insurance  Release of information consent forms completed and in the chart;  Patient's signature needed at discharge.  Patient to Follow up at: Follow-up Information   Follow up with [facility]. Santina Evans at [facility] will call you with followup appointment)    Contact information:   [address] [place] [address] [facility] [phone] Fax:       Patient denies SI/HI:   Yes,  denies both    Safety Planning and Suicide Prevention discussed:  Yes,  with patient  [name] 05/25/2012, 9:19 AM

## 2012-05-27 NOTE — Progress Notes (Signed)
Patient Discharge Instructions:  Records sent via mail to: [facility] [address] [place], [place] [address]  Marcus [name], 05/27/2012, 1:47 PM

## 2013-11-16 ENCOUNTER — Encounter (HOSPITAL_COMMUNITY): Payer: Self-pay | Admitting: Emergency Medicine

## 2013-11-16 ENCOUNTER — Emergency Department (HOSPITAL_COMMUNITY)
Admission: EM | Admit: 2013-11-16 | Discharge: 2013-11-17 | Disposition: A | Discharge status: Home / Self Care | Payer: Medicaid Other | Attending: Emergency Medicine | Admitting: Emergency Medicine

## 2013-11-16 DIAGNOSIS — F1110 Opioid abuse, uncomplicated: Secondary | ICD-10-CM

## 2013-11-16 DIAGNOSIS — G479 Sleep disorder, unspecified: Secondary | ICD-10-CM | POA: Insufficient documentation

## 2013-11-16 DIAGNOSIS — F1310 Sedative, hypnotic or anxiolytic abuse, uncomplicated: Secondary | ICD-10-CM | POA: Insufficient documentation

## 2013-11-16 DIAGNOSIS — R509 Fever, unspecified: Secondary | ICD-10-CM | POA: Insufficient documentation

## 2013-11-16 DIAGNOSIS — F411 Generalized anxiety disorder: Secondary | ICD-10-CM | POA: Diagnosis not present

## 2013-11-16 DIAGNOSIS — IMO0001 Reserved for inherently not codable concepts without codable children: Secondary | ICD-10-CM | POA: Diagnosis not present

## 2013-11-16 DIAGNOSIS — G8929 Other chronic pain: Secondary | ICD-10-CM | POA: Diagnosis not present

## 2013-11-16 DIAGNOSIS — R61 Generalized hyperhidrosis: Secondary | ICD-10-CM | POA: Insufficient documentation

## 2013-11-16 DIAGNOSIS — Z79899 Other long term (current) drug therapy: Secondary | ICD-10-CM | POA: Diagnosis not present

## 2013-11-16 DIAGNOSIS — F1510 Other stimulant abuse, uncomplicated: Secondary | ICD-10-CM | POA: Diagnosis not present

## 2013-11-16 DIAGNOSIS — R45851 Suicidal ideations: Secondary | ICD-10-CM | POA: Diagnosis present

## 2013-11-16 DIAGNOSIS — F331 Major depressive disorder, recurrent, moderate: Secondary | ICD-10-CM

## 2013-11-16 DIAGNOSIS — F1910 Other psychoactive substance abuse, uncomplicated: Secondary | ICD-10-CM

## 2013-11-16 LAB — RAPID URINE DRUG SCREEN, HOSP PERFORMED
AMPHETAMINES: POSITIVE — AB
Barbiturates: NOT DETECTED
Benzodiazepines: POSITIVE — AB
Cocaine: NOT DETECTED
Opiates: NOT DETECTED
TETRAHYDROCANNABINOL: NOT DETECTED

## 2013-11-16 LAB — COMPREHENSIVE METABOLIC PANEL
ALK PHOS: 88 U/L (ref 39–117)
ALT: 111 U/L — AB (ref 0–53)
AST: 70 U/L — ABNORMAL HIGH (ref 0–37)
Albumin: 4 g/dL (ref 3.5–5.2)
Anion gap: 15 (ref 5–15)
BUN: 16 mg/dL (ref 6–23)
CALCIUM: 9.3 mg/dL (ref 8.4–10.5)
CO2: 23 meq/L (ref 19–32)
Chloride: 100 mEq/L (ref 96–112)
Creatinine, Ser: 1.23 mg/dL (ref 0.50–1.35)
GFR, EST AFRICAN AMERICAN: 82 mL/min — AB (ref >90)
GFR, EST NON AFRICAN AMERICAN: 70 mL/min — AB (ref >90)
GLUCOSE: 161 mg/dL — AB (ref 70–99)
Potassium: 3.8 mEq/L (ref 3.7–5.3)
SODIUM: 138 meq/L (ref 137–147)
Total Bilirubin: 1 mg/dL (ref 0.3–1.2)
Total Protein: 7.5 g/dL (ref 6.0–8.3)

## 2013-11-16 LAB — ETHANOL: Alcohol, Ethyl (B): <11 mg/dL (ref 0–11)

## 2013-11-16 LAB — CBC
HEMATOCRIT: 43.8 % (ref 39.0–52.0)
HEMOGLOBIN: 15.1 g/dL (ref 13.0–17.0)
MCH: 28.8 pg (ref 26.0–34.0)
MCHC: 34.5 g/dL (ref 30.0–36.0)
MCV: 83.4 fL (ref 78.0–100.0)
Platelets: 215 10*3/uL (ref 150–400)
RBC: 5.25 MIL/uL (ref 4.22–5.81)
RDW: 12.9 % (ref 11.5–15.5)
WBC: 7.4 10*3/uL (ref 4.0–10.5)

## 2013-11-16 LAB — SALICYLATE LEVEL: SALICYLATE LVL: 6.7 mg/dL (ref 2.8–20.0)

## 2013-11-16 LAB — ACETAMINOPHEN LEVEL: Acetaminophen (Tylenol), Serum: <15 ug/mL (ref 10–30)

## 2013-11-16 MED ORDER — DICYCLOMINE HCL 20 MG PO TABS: 20.0000 mg | ORAL_TABLET | Freq: Four times a day (QID) | ORAL | Status: DC | PRN

## 2013-11-16 MED ORDER — LORAZEPAM 1 MG PO TABS: 1.0000 mg | ORAL_TABLET | Freq: Three times a day (TID) | ORAL | Status: DC | PRN

## 2013-11-16 MED ORDER — TESTOSTERONE 50 MG/5GM (1%) TD GEL
5.0000 g | Freq: Every day | TRANSDERMAL | Status: DC
  Administered 2013-11-16: 5 g via TRANSDERMAL
  Filled 2013-11-16 (×8): qty 5

## 2013-11-16 MED ORDER — IBUPROFEN 200 MG PO TABS
600.0000 mg | ORAL_TABLET | Freq: Three times a day (TID) | ORAL | Status: DC | PRN
  Administered 2013-11-16: 600 mg via ORAL
  Filled 2013-11-16: qty 3

## 2013-11-16 MED ORDER — HYDROXYZINE HCL 25 MG PO TABS
25.0000 mg | ORAL_TABLET | Freq: Four times a day (QID) | ORAL | Status: DC | PRN
  Administered 2013-11-16: 25 mg via ORAL
  Filled 2013-11-16: qty 1

## 2013-11-16 MED ORDER — ZOLPIDEM TARTRATE 5 MG PO TABS: 5.0000 mg | ORAL_TABLET | Freq: Every evening | ORAL | Status: DC | PRN

## 2013-11-16 MED ORDER — ONDANSETRON HCL 4 MG PO TABS: 4.0000 mg | ORAL_TABLET | Freq: Three times a day (TID) | ORAL | Status: DC | PRN

## 2013-11-16 MED ORDER — ACETAMINOPHEN 325 MG PO TABS
650.0000 mg | ORAL_TABLET | ORAL | Status: DC | PRN
  Administered 2013-11-16 – 2013-11-17 (×2): 650 mg via ORAL
  Filled 2013-11-16 (×2): qty 2

## 2013-11-16 MED ORDER — METHOCARBAMOL 500 MG PO TABS
500.0000 mg | ORAL_TABLET | Freq: Three times a day (TID) | ORAL | Status: DC | PRN
  Filled 2013-11-16: qty 1

## 2013-11-16 MED ORDER — LOPERAMIDE HCL 2 MG PO CAPS: 2.0000 mg | ORAL_CAPSULE | ORAL | Status: DC | PRN

## 2013-11-16 MED ORDER — CLONIDINE HCL 0.1 MG PO TABS: 0.1000 mg | ORAL_TABLET | Freq: Every day | ORAL | Status: DC

## 2013-11-16 MED ORDER — NICOTINE 21 MG/24HR TD PT24
21.0000 mg | MEDICATED_PATCH | Freq: Every day | TRANSDERMAL | Status: DC
  Filled 2013-11-16: qty 1

## 2013-11-16 MED ORDER — CLONIDINE HCL 0.1 MG PO TABS: 0.1000 mg | ORAL_TABLET | ORAL | Status: DC

## 2013-11-16 MED ORDER — CLONIDINE HCL 0.1 MG PO TABS
0.1000 mg | ORAL_TABLET | Freq: Four times a day (QID) | ORAL | Status: DC
  Administered 2013-11-16 – 2013-11-17 (×2): 0.1 mg via ORAL
  Filled 2013-11-16 (×2): qty 1

## 2013-11-16 NOTE — ED Provider Notes (Signed)
Medical screening examination/treatment/procedure(s) were performed by non-physician practitioner and as supervising physician I was immediately available for consultation/collaboration.   EKG Interpretation None      Devoria Albe, MD, Armando Gang   Ward Givens, MD 11/16/13 Norberta Keens

## 2013-11-16 NOTE — BH Assessment (Signed)
Assessment Note  REASON Marcus [name] is an 43 y.o. male. Patient was brought into the ED by his wife because of SI with plan to shoot self. "I been thinking about blowing my brains out".  Patient for over a year he was on a methadone maintenance program and for the past 2 weeks been trying to withdrawal.  Patient reports that the withdrawal is prolonged and severe.  Patient continues to express SI but without active plan because he explains he feels say here.  Patient denies HI, hallucinations, or other self-injurious behaviors.  Patient reports at home he was feeling paranoid for no particular reason thinking people were coming in his home or stealing his belongings.  Patient broke the windows out of his car 2 weeks ago and can not remember do this and for this reason he warned his family that he did not feel safe with them in the house.  Patient reports having crying spells for the past 8 days but not today.  Patient reports current withdrawal symptoms includes sweats, chills, depression, guilt, increased anxiety, SI, and body aches.  Patient is not requesting or wanting any pain medication during this visit but would like to get some help.  Patient reports a past suicide attempt 10 years ago by overdose after the death of his grandmother.    CSW ran patient with Karleen Hampshire, PA it is recommended refer for observation unit pending available.    Axis I: Substance Induced Mood Disorder and Opiate Dependence Axis II: Deferred Axis III:  Past Medical History  Diagnosis Date  . Chronic back pain    Axis IV: other psychosocial or environmental problems, problems related to social environment and problems with access to health care services Axis V: 41-50 serious symptoms  Past Medical History:  Past Medical History  Diagnosis Date  . Chronic back pain     Past Surgical History  Procedure Laterality Date  . None      Family History: History reviewed. No pertinent family history.  Social History:   reports that he has never smoked. He has never used smokeless tobacco. He reports that he does not drink alcohol or use illicit drugs.  Additional Social History:     CIWA: CIWA-Ar BP: 130/91 mmHg Pulse Rate: 77 COWS: Clinical Opiate Withdrawal Scale (COWS) Resting Pulse Rate: Pulse Rate 80 or below Sweating: No report of chills or flushing Restlessness: Able to sit still Pupil Size: Pupils pinned or normal size for room light Bone or Joint Aches: Patient reports sever diffuse aching of joints/muscles Runny Nose or Tearing: Not present GI Upset: No GI symptoms Tremor: No tremor Yawning: No yawning Anxiety or Irritability: Patient so irritable or anxious that participation in the assessment is difficult Gooseflesh Skin: Skin is smooth COWS Total Score: 6  Allergies: No Known Allergies  Home Medications:  (Not in a hospital admission)  OB/GYN Status:  No LMP for male patient.  General Assessment Data Location of Assessment: [facility] ED ACT Assessment: Yes Is this a Tele or Face-to-Face Assessment?: Face-to-Face Is this an Initial Assessment or a Re-assessment for this encounter?: Initial Assessment Living Arrangements: Spouse/significant other;Children Can pt return to current living arrangement?: Yes Admission Status: Voluntary Is patient capable of signing voluntary admission?: Yes Transfer from: Home Referral Source: Self/Family/Friend  Medical Screening Exam [facility] Walk-in ONLY) Medical Exam completed: Yes  [facility] Crisis Care Plan Living Arrangements: Spouse/significant other;Children Name of Psychiatrist: none Name of Therapist: none     Risk to self with the past 6  months Suicidal Ideation: Yes-Currently Present Suicidal Intent:  (denies) Is patient at risk for suicide?: Yes Suicidal Plan?: No-Not Currently/Within Last 6 Months Access to Means: No What has been your use of drugs/alcohol within the last 12 months?: opiates Previous Attempts/Gestures: Yes How many  times?: 1 Other Self Harm Risks: none Triggers for Past Attempts: Family contact;Anniversary;Other (Comment) (death of grandmother) Intentional Self Injurious Behavior: None Family Suicide History: No Recent stressful life event(s): Loss (Comment);Other (Comment) (withdrawal) Persecutory voices/beliefs?: No Depression: Yes Depression Symptoms: Tearfulness;Isolating;Fatigue;Guilt;Loss of interest in usual pleasures;Feeling angry/irritable;Feeling worthless/self pity Substance abuse history and/or treatment for substance abuse?: Yes  Risk to Others within the past 6 months Homicidal Ideation: No Thoughts of Harm to Others: No Current Homicidal Intent: No Access to Homicidal Means: No History of harm to others?: No Does patient have access to weapons?: No Criminal Charges Pending?: No Does patient have a court date: No  Psychosis Hallucinations: None noted Delusions: None noted  Mental Status Report Appear/Hygiene: In hospital gown Eye Contact: Fair Motor Activity: Freedom of movement Speech: Logical/coherent Level of Consciousness: Alert Mood: Depressed Affect: Anxious Anxiety Level: Moderate Thought Processes: Coherent Judgement: Unimpaired Orientation: Person;Place;Time;Situation Obsessive Compulsive Thoughts/Behaviors: None  Cognitive Functioning Concentration: Fair Memory: Recent Intact;Remote Intact IQ: Average Insight: Fair Impulse Control: Fair Appetite: Fair Sleep: Decreased Vegetative Symptoms: None  ADLScreening [facility] Assessment Services) Patient's cognitive ability adequate to safely complete daily activities?: Yes Patient able to express need for assistance with ADLs?: Yes Independently performs ADLs?: Yes (appropriate for developmental age)  Prior Inpatient Therapy Prior Inpatient Therapy: Yes Prior Therapy Facilty/Provider(s): [facility] [place] Reason for Treatment: SI,SA  Prior Outpatient Therapy Prior Outpatient Therapy: Yes Prior Therapy  Facilty[facility]Provider(s): [facility] 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, Methadon Maintance  ADL Screening (condition at time of admission) Patient's cognitive ability adequate to safely complete daily activities?: Yes Patient able to express need for assistance with ADLs?: Yes Independently performs ADLs?: Yes (appropriate for developmental age)         Values / Beliefs Cultural Requests During Hospitalization: None Spiritual Requests During Hospitalization: None   Advance Directives (For Healthcare) Does patient have an advance directive?: No Would patient like information on creating an advanced directive?: No - patient declined information    Additional Information 1:1 In Past 12 Months?: No CIRT Risk: No Elopement Risk: No Does patient have medical clearance?: Yes     Disposition:  Disposition Initial Assessment Completed for this Encounter: Yes Disposition of Patient: Inpatient treatment program Type of inpatient treatment program: Adult  On Site Evaluation by:   Reviewed with Physician:    Maryelizabeth Rowan A 11/16/2013 9:54 PM

## 2013-11-16 NOTE — ED Notes (Signed)
TTS in with patient.  

## 2013-11-16 NOTE — ED Notes (Signed)
Sitter at bedside.

## 2013-11-16 NOTE — ED Notes (Signed)
Patient denies SI at present. States SI present previously and that he did not feel safe at home. Reports poor sleep last 8 days and decreased appetite related to anxiety and pain. Denies HI, AVH. Rates anxiety 8/10 and depression 10/10. States he has chronic back pain and has been off methadone approximately 2 weeks. Currently reports feeling achy, sore and weak.  Encouragement offered. Given Motrin, Vistaril, meal. Environment adjusted.  Q 15 safety checks in place.

## 2013-11-16 NOTE — ED Provider Notes (Signed)
CSN: [id number]     Arrival date & time 11/16/13  1708 History  This chart was scribed for non-physician practitioner working with Ward Givens, MD, by Modena Jansky, ED Scribe. This patient was seen in room [facility] and the patient's care was started at 6:33 PM.   Chief Complaint  Patient presents with  . Suicidal    The history is provided by the patient. No language interpreter was used.   HPI Comments: Marcus [name] is a 43 y.o. male who presents to the Emergency Department complaining of suicidal ideation. Pt reports that he chose to be off his methadone for about 2 weeks. He states that he went to the doctor and was prescribed Soboxin. He reports that he went into withdrawal and broke all the windows in his car. Pt states that his plan is to shoot himself, but he does not own a gun. He states that he has generalized body aches which he rates as a 8/10 in severity. He states that he is currently anxious and has been having multiple mental breakdowns recently. He reports that he has loss some weight and has been having difficulty sleeping. He denies any HI or hallucinations.   Past Medical History  Diagnosis Date  . Chronic back pain    Past Surgical History  Procedure Laterality Date  . None     History reviewed. No pertinent family history. History  Substance Use Topics  . Smoking status: Never Smoker   . Smokeless tobacco: Never Used  . Alcohol Use: No     Comment: denies 11/16/13    Review of Systems  Constitutional: Positive for fever, chills, diaphoresis and unexpected weight change.  Musculoskeletal: Positive for myalgias.  Psychiatric/Behavioral: Positive for suicidal ideas and sleep disturbance. Negative for hallucinations. The patient is nervous/anxious.   All other systems reviewed and are negative.   Allergies  Review of patient's allergies indicates no known allergies.  Home Medications   Prior to Admission medications   Medication Sig Start Date End  Date Taking? Authorizing Provider  clonazePAM (KLONOPIN) 1 MG tablet Take 1 mg by mouth 2 (two) times daily.   Yes Historical Provider, MD  testosterone (ANDROGEL) 50 MG/5GM (1%) GEL Place 5 g onto the skin daily.   Yes Historical Provider, MD   BP 132/82  Pulse 103  Temp(Src) 98 F (36.7 C) (Oral)  Resp 16  Wt 197 lb (89.359 kg)  SpO2 97% Physical Exam  Nursing note and vitals reviewed. Constitutional: He is oriented to person, place, and time. He appears well-developed and well-nourished.  HENT:  Head: Normocephalic and atraumatic.  Mouth/Throat: Oropharynx is clear and moist. No oropharyngeal exudate.  Eyes: Conjunctivae and EOM are normal. Pupils are equal, round, and reactive to light. No scleral icterus.  Neck: Normal range of motion. Neck supple. No tracheal deviation present. No thyromegaly present.  Cardiovascular: Normal rate, regular rhythm, normal heart sounds and intact distal pulses.  Exam reveals no gallop and no friction rub.   No murmur heard. Pulmonary/Chest: Effort normal and breath sounds normal. No respiratory distress. He has no wheezes. He has no rales. He exhibits no tenderness.  Abdominal: Soft. Bowel sounds are normal. He exhibits no distension and no mass. There is no tenderness. There is no rebound and no guarding.  Musculoskeletal: Normal range of motion.  Lymphadenopathy:    He has no cervical adenopathy.  Neurological: He is alert and oriented to person, place, and time. No cranial nerve deficit. Coordination normal.  Skin:  Skin is warm and dry.  0.5 cm wound to the anterior cubital fossa with surrounding redness and palpable warmth.  There is no active discharge or drainage at this time.    Psychiatric: His speech is normal. His mood appears anxious. He is hyperactive. Thought content is not paranoid. He expresses suicidal ideation. He expresses no homicidal ideation. He expresses suicidal plans. He expresses no homicidal plans.    ED Course   Procedures (including critical care time) DIAGNOSTIC STUDIES: Oxygen Saturation is 97% on RA, normal by my interpretation.    COORDINATION OF CARE: 6:37 PM- Pt advised of plan for treatment which includes labs and medications and pt agrees.  Labs Review Labs Reviewed  COMPREHENSIVE METABOLIC PANEL - Abnormal; Notable for the following:    Glucose, Bld 161 (*)    AST 70 (*)    ALT 111 (*)    GFR calc non Af Amer 70 (*)    GFR calc Af Amer 82 (*)    All other components within normal limits  URINE RAPID DRUG SCREEN (HOSP PERFORMED) - Abnormal; Notable for the following:    Benzodiazepines POSITIVE (*)    Amphetamines POSITIVE (*)    All other components within normal limits  ACETAMINOPHEN LEVEL  CBC  ETHANOL  SALICYLATE LEVEL    Imaging Review No results found.   EKG Interpretation None      MDM   Final diagnoses:  Opiate abuse, continuous  Polysubstance abuse  Suicidal ideation   Patient is a 43 y.o. Male who presents to the ED with complaint of suicidal ideation and withdrawal from opiates.  Physical exam reveals a wound to the anterior cubital fossa with surrounding warmth and tenderness.  Patient has not been taking his antibiotics.  Patient will likely need zofran in addition to bactrim DS BID for further treatment of abscess to the left arm.  Patient medically cleared at this time.  Basic labs show mild elevation of LFTs.  UDS is opiate positive and amphetamine positive.  All other labs unremarkable.  Patient is medically cleared at this time.  Patient to be transferred to the psych ED.  Clonidine order set in place for withdrawal symptoms.  Patient to be seen by TTS.  Appreciate psych input.     I personally performed the services described in this documentation, which was scribed in my presence. The recorded information has been reviewed and is accurate.     Eben Burow, PA-C 11/16/13 1919

## 2013-11-16 NOTE — ED Notes (Signed)
Pt reports SI, plan is to shoot himself with a gun, pt does not own a gun. Pt attempted SI by OD 5 years ago. Pt denies HI, AH/VH. Reports he does get angry from all the pain he is experiencing. Pt reports he has been off methadone x2 weeks. Pt is having difficulty with withdrawals, reports body aches 8/10. Has slept 7 hours in past 8 days.

## 2013-11-17 DIAGNOSIS — F1994 Other psychoactive substance use, unspecified with psychoactive substance-induced mood disorder: Secondary | ICD-10-CM

## 2013-11-17 DIAGNOSIS — F1120 Opioid dependence, uncomplicated: Secondary | ICD-10-CM

## 2013-11-17 NOTE — Progress Notes (Signed)
  CARE MANAGEMENT ED NOTE 11/17/2013  Patient:  Marcus [name], Marcus [name]   Account Number:  [id number]  Date Initiated:  11/17/2013  Documentation initiated by:  Edd Arbour  Subjective/Objective Assessment:   43 yr old medicaid of [place] [facility] [place] [facility] pt reports SI, plan is to shoot himself with a gun, pt does not own a gun. Pt attempted SI by OD 5 years ago. Pt denies HI, AH/VH. Reports he does get angry from all the pain he is experiencing.     Subjective/Objective Assessment Detail:   dx   Substance Induced Mood Disorder and opioid dependence     PMH chronic back pain  no pcp was listed in EPIC  Pt states he sees Tarri Fuller  plan: seek a drug rehab program as he says he is not SI l in spite of what was stated upon admission.  If no bed can be found, will likely be discharged to follow up with rehab on his own     Action/Plan:   Epic updated   Action/Plan Detail:   Anticipated DC Date:  11/17/2013     Status Recommendation to Physician:   Result of Recommendation:    Other ED Services  Consult Working Plan    DC Planning Services  Other  PCP issues  Outpatient Services - Pt will follow up    Choice offered to / List presented to:            Status of service:  Completed, signed off  ED Comments:   ED Comments Detail:

## 2013-11-17 NOTE — Consult Note (Signed)
[name] Face-to-Face Psychiatry Consult   Reason for Consult:  Requesting detox from opiates and originally expressing  suicidal thoughts Referring Physician:  ER MD  Marcus [name] is an 43 y.o. male. 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 Chronic back pain    AXIS IV:  economic problems and chronic addiction AXIS V:  51-60 moderate symptoms  Plan:  No evidence of imminent risk to self or others at present.    Subjective:   Marcus [name] is a 42 y.o. male patient admitted with requesting detox and making suicidal threats.  HPI:  Marcus [name] says he has not used methadone for at least 2 weeks and is still having severe withdrawal.  He was already off methadone before that and was begun on suboxone but he said there was still methadone in his system and he had a very bad reaction.  He currently does not want to be on suboxone but wants to go to a detox facility.  He now denies any suicidal ideation and says he just wants to go home after I told him it would be unlikely anyone would take him for detox and rehabs are generally full. HPI Elements:   Location:  opiate dependence. Quality:  having some suicidal thoughts. Severity:  says he continues having severe withdrawal pain after 2-4 weeks off methadone. Timing:  trying to get off methadone. Duration:  6 months of trying to stop using. Context:  as above.  Past Psychiatric History: Past Medical History  Diagnosis Date  . Chronic back pain     reports that he has never smoked. He has never used smokeless tobacco. He reports that he does not drink alcohol or use illicit drugs. History reviewed. No pertinent family history. Family History Substance Abuse:  (opiates) Family Supports: Yes, List: (wife, mother) Living Arrangements: Spouse/significant other;Children Can pt  return to current living arrangement?: Yes   Allergies:  No Known Allergies  ACT Assessment Complete:  Yes:    Educational Status    Risk to Self: Risk to self with the past 6 months Suicidal Ideation: Yes-Currently Present Suicidal Intent:  (denies) Is patient at risk for suicide?: Yes Suicidal Plan?: No-Not Currently/Within Last 6 Months Access to Means: No What has been your use of drugs/alcohol within the last 12 months?: opiates Previous Attempts/Gestures: Yes How many times?: 1 Other Self Harm Risks: none Triggers for Past Attempts: Family contact;Anniversary;Other (Comment) (death of grandmother) Intentional Self Injurious Behavior: None Family Suicide History: No Recent stressful life event(s): Loss (Comment);Other (Comment) (withdrawal) Persecutory voices/beliefs?: No Depression: Yes Depression Symptoms: Tearfulness;Isolating;Fatigue;Guilt;Loss of interest in usual pleasures;Feeling angry/irritable;Feeling worthless/self pity Substance abuse history and/or treatment for substance abuse?: Yes  Risk to Others: Risk to Others within the past 6 months Homicidal Ideation: No Thoughts of Harm to Others: No Current Homicidal Intent: No Access to Homicidal Means: No History of harm to others?: No Does patient have access to weapons?: No Criminal Charges Pending?: No Does patient have a court date: No  Abuse:    Prior Inpatient Therapy: Prior Inpatient Therapy Prior Inpatient Therapy: Yes Prior Therapy Facilty/Provider(s): [facility], [place] Reason for Treatment: SI,SA  Prior Outpatient Therapy: Prior Outpatient Therapy Prior Outpatient Therapy: Yes Prior Therapy Facilty/Provider(s): [facility] 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, Methadon Maintance  Additional Information: Additional Information 1:1 In Past 12 Months?: No  CIRT Risk: No Elopement Risk: No Does patient have medical clearance?: Yes                  Objective: Blood pressure 95/62,  pulse 71, temperature 97.6 F (36.4 C), temperature source Oral, resp. rate 18, weight 89.359 kg (197 lb), SpO2 95.00%.Body mass index is 27.86 kg/(m^2). Results for orders placed during the hospital encounter of 11/16/13 (from the past 72 hour(s))  URINE RAPID DRUG SCREEN (HOSP PERFORMED)     Status: Abnormal   Collection Time    11/16/13  5:41 PM      Result Value Ref Range   Opiates NONE DETECTED  NONE DETECTED   Cocaine NONE DETECTED  NONE DETECTED   Benzodiazepines POSITIVE (*) NONE DETECTED   Amphetamines POSITIVE (*) NONE DETECTED   Tetrahydrocannabinol NONE DETECTED  NONE DETECTED   Barbiturates NONE DETECTED  NONE DETECTED   Comment:            DRUG SCREEN FOR MEDICAL PURPOSES     ONLY.  IF CONFIRMATION IS NEEDED     FOR ANY PURPOSE, NOTIFY LAB     WITHIN 5 DAYS.                LOWEST DETECTABLE LIMITS     FOR URINE DRUG SCREEN     Drug Class       Cutoff (ng/mL)     Amphetamine      1000     Barbiturate      200     Benzodiazepine   884     Tricyclics       166     Opiates          300     Cocaine          300     THC              50  ACETAMINOPHEN LEVEL     Status: None   Collection Time    11/16/13  6:05 PM      Result Value Ref Range   Acetaminophen (Tylenol), Serum <15.0  10 - 30 ug/mL   Comment:            THERAPEUTIC CONCENTRATIONS VARY     SIGNIFICANTLY. A RANGE OF 10-30     ug/mL MAY BE AN EFFECTIVE     CONCENTRATION FOR MANY PATIENTS.     HOWEVER, SOME ARE BEST TREATED     AT CONCENTRATIONS OUTSIDE THIS     RANGE.     ACETAMINOPHEN CONCENTRATIONS     >150 ug/mL AT 4 HOURS AFTER     INGESTION AND >50 ug/mL AT 12     HOURS AFTER INGESTION ARE     OFTEN ASSOCIATED WITH TOXIC     REACTIONS.  CBC     Status: None   Collection Time    11/16/13  6:05 PM      Result Value Ref Range   WBC 7.4  4.0 - 10.5 K/uL   RBC 5.25  4.22 - 5.81 MIL/uL   Hemoglobin 15.1  13.0 - 17.0 g/dL   HCT 43.8  39.0 - 52.0 %   MCV 83.4  78.0 - 100.0 fL   MCH 28.8  26.0 -  34.0 pg   MCHC 34.5  30.0 - 36.0 g/dL   RDW 12.9  11.5 - 15.5 %   Platelets 215  150 - 400 K/uL  COMPREHENSIVE METABOLIC PANEL     Status:  Abnormal   Collection Time    11/16/13  6:05 PM      Result Value Ref Range   Sodium 138  137 - 147 mEq/L   Potassium 3.8  3.7 - 5.3 mEq/L   Chloride 100  96 - 112 mEq/L   CO2 23  19 - 32 mEq/L   Glucose, Bld 161 (*) 70 - 99 mg/dL   BUN 16  6 - 23 mg/dL   Creatinine, Ser 1.23  0.50 - 1.35 mg/dL   Calcium 9.3  8.4 - 10.5 mg/dL   Total Protein 7.5  6.0 - 8.3 g/dL   Albumin 4.0  3.5 - 5.2 g/dL   AST 70 (*) 0 - 37 U/L   ALT 111 (*) 0 - 53 U/L   Alkaline Phosphatase 88  39 - 117 U/L   Total Bilirubin 1.0  0.3 - 1.2 mg/dL   GFR calc non Af Amer 70 (*) >90 mL/min   GFR calc Af Amer 82 (*) >90 mL/min   Comment: (NOTE)     The eGFR has been calculated using the CKD EPI equation.     This calculation has not been validated in all clinical situations.     eGFR's persistently <90 mL/min signify possible Chronic Kidney     Disease.   Anion gap 15  5 - 15  ETHANOL     Status: None   Collection Time    11/16/13  6:05 PM      Result Value Ref Range   Alcohol, Ethyl (B) <11  0 - 11 mg/dL   Comment:            LOWEST DETECTABLE LIMIT FOR     SERUM ALCOHOL IS 11 mg/dL     FOR MEDICAL PURPOSES ONLY  SALICYLATE LEVEL     Status: None   Collection Time    11/16/13  6:05 PM      Result Value Ref Range   Salicylate Lvl 6.7  2.8 - 20.0 mg/dL   Labs are reviewed and are pertinent for benzodiazepines and amphetamines.  Current Facility-Administered Medications  Medication Dose Route Frequency Provider Last Rate Last Dose  . acetaminophen (TYLENOL) tablet 650 mg  650 mg Oral Q4H PRN Courtney A Forcucci, PA-C   650 mg at 11/16/13 2201  . cloNIDine (CATAPRES) tablet 0.1 mg  0.1 mg Oral QID Courtney A Forcucci, PA-C   0.1 mg at 11/16/13 2152   Followed by  . [START ON 11/19/2013] cloNIDine (CATAPRES) tablet 0.1 mg  0.1 mg Oral BH-qamhs Courtney A Forcucci,  PA-C       Followed by  . [START ON 11/21/2013] cloNIDine (CATAPRES) tablet 0.1 mg  0.1 mg Oral QAC breakfast Courtney A Forcucci, PA-C      . dicyclomine (BENTYL) tablet 20 mg  20 mg Oral Q6H PRN Courtney A Forcucci, PA-C      . hydrOXYzine (ATARAX/VISTARIL) tablet 25 mg  25 mg Oral Q6H PRN Courtney A Forcucci, PA-C   25 mg at 11/16/13 2010  . ibuprofen (ADVIL,MOTRIN) tablet 600 mg  600 mg Oral Q8H PRN Courtney A Forcucci, PA-C   600 mg at 11/16/13 2010  . loperamide (IMODIUM) capsule 2-4 mg  2-4 mg Oral PRN Courtney A Forcucci, PA-C      . LORazepam (ATIVAN) tablet 1 mg  1 mg Oral Q8H PRN Courtney A Forcucci, PA-C      . methocarbamol (ROBAXIN) tablet 500 mg  500 mg Oral Q8H PRN Courtney A Forcucci, PA-C      .  nicotine (NICODERM CQ - dosed in mg/24 hours) patch 21 mg  21 mg Transdermal Daily Courtney A Forcucci, PA-C      . ondansetron (ZOFRAN) tablet 4 mg  4 mg Oral Q8H PRN Courtney A Forcucci, PA-C      . testosterone (ANDROGEL) 50 MG/5GM (1%) gel 5 g  5 g Transdermal Daily Courtney A Forcucci, PA-C   5 g at 11/16/13 2154  . zolpidem (AMBIEN) tablet 5 mg  5 mg Oral QHS PRN Cherylann Parr, PA-C       Current Outpatient Prescriptions  Medication Sig Dispense Refill  . clonazePAM (KLONOPIN) 1 MG tablet Take 1 mg by mouth 2 (two) times daily.      Marland Kitchen testosterone (ANDROGEL) 50 MG/5GM (1%) GEL Place 5 g onto the skin daily.        Psychiatric Specialty Exam:     Blood pressure 95/62, pulse 71, temperature 97.6 F (36.4 C), temperature source Oral, resp. rate 18, weight 89.359 kg (197 lb), SpO2 95.00%.Body mass index is 27.86 kg/(m^2).  General Appearance: Casual  Eye Contact::  Good  Speech:  Clear and Coherent  Volume:  Normal  Mood:  Irritable  Affect:  Labile and angry  Thought Process:  Coherent  Orientation:  Full (Time, Place, and Person)  Thought Content:  Negative  Suicidal Thoughts:  No  Homicidal Thoughts:  No  Memory:  Immediate;   Good Recent;   Good Remote;    Good  Judgement:  Poor  Insight:  Shallow  Psychomotor Activity:  Normal  Concentration:  Good  Recall:  Good  Fund of Knowledge:Good  Language: Good  Akathisia:  Negative  Handed:  Right  AIMS (if indicated):     Assets:  Communication Skills Desire for Improvement  Sleep:      Musculoskeletal: Strength & Muscle Tone: within normal limits Gait & Station: normal Patient leans: N/A  Treatment Plan Summary: will seek a drug rehab program as he says he is not suicidal in spite of the story he told upon admission.  If no bed can be found, will likely be discharged to follow up with rehab on his own  Clarene Reamer 11/17/2013 9:12 AM

## 2013-11-17 NOTE — Discharge Instructions (Signed)
Chemical Dependency Chemical dependency is an addiction to drugs or alcohol. It is characterized by the repeated behavior of seeking out and using drugs and alcohol despite harmful consequences to the health and safety of ones self and others.  RISK FACTORS There are certain situations or behaviors that increase a person's risk for chemical dependency. These include:  A family history of chemical dependency.  A history of mental health issues, including depression and anxiety.  A home environment where drugs and alcohol are easily available to you.  Drug or alcohol use at a young age. SYMPTOMS  The following symptoms can indicate chemical dependency:  Inability to limit the use of drugs or alcohol.  Nausea, sweating, shakiness, and anxiety that occurs when alcohol or drugs are not being used.  An increase in amount of drugs or alcohol that is necessary to get drunk or high. People who experience these symptoms can assess their use of drugs and alcohol by asking themselves the following questions:  Have you been told by friends or family that they are worried about your use of alcohol or drugs?  Do friends and family ever tell you about things you did while drinking alcohol or using drugs that you do not remember?  Do you lie about using alcohol or drugs or about the amounts you use?  Do you have difficulty completing daily tasks unless you use alcohol or drugs?  Is the level of your work or school performance lower because of your drug or alcohol use?  Do you get sick from using drugs or alcohol but keep using anyway?  Do you feel uncomfortable in social situations unless you use alcohol or drugs?  Do you use drugs or alcohol to help forget problems? An answer of yes to any of these questions may indicate chemical dependency. Professional evaluation is suggested. Document Released: 02/04/2001 Document Revised: 05/05/2011 Document Reviewed: 04/18/2010 [facility] Patient  Information 2015 [place], [place]. This information is not intended to replace advice given to you by your health care provider. Make sure you discuss any questions you have with your health care provider.  Drug Abuse and Addiction in Sports There are many types of drugs that one may become addicted to including illegal drugs (marijuana, cocaine, amphetamines, hallucinogens, and narcotics), prescription drugs (hydrocodone, codeine, and alprazolam), and other chemicals such as alcohol or nicotine. Two types of addiction exist: physical and emotional. Physical addiction usually occurs after prolonged use of a drug. However, some drugs may only take a couple uses before addiction can occur. Physical addiction is marked by withdrawal symptoms in which the person experiences negative symptoms such as sweat, anxiety, tremors, hallucinations, or cravings in the absence of using the drug. Emotional dependence is the psychological desire for the "high" that the drugs produce when taken. SYMPTOMS   Inattentiveness.  Negligence.  Forgetfulness.  Insomnia.  Mood swings. RISK INCREASES WITH:   Family history of addiction.  Personal history of addictive personality. Studies have shown that risk takers, which many athletes are, have a higher risk of addiction. PREVENTION The only adequate prevention of drug abuse is abstinence from drugs. TREATMENT  The first step in quitting substance abuse is recognizing the problem and realizing that one has the power to change. Quitting requires a plan and support from others. It is often necessary to seek medical assistance. Caregivers are available to offer counseling, and for certain cases, medicine to diminish the physical symptoms of withdrawal. Many organizations exist such as Alcoholics Anonymous, Narcotics Anonymous, or the  [employer] on Alcoholism that offer support for individuals who have chosen to quit their habits. Document Released: 02/10/2005  Document Revised: 06/27/2013 Document Reviewed: 05/25/2008 [facility] Patient Information 2015 [place], [place]. This information is not intended to replace advice given to you by your health care provider. Make sure you discuss any questions you have with your health care provider.

## 2013-11-17 NOTE — [facility] Suicide Risk Assessment (Cosign Needed)
Suicide Risk Assessment  Discharge Assessment     Demographic Factors:  Male and Caucasian  Total Time spent with patient: 30 minutes  Psychiatric Specialty Exam:     Blood pressure 95/62, pulse 71, temperature 97.6 F (36.4 C), temperature source Oral, resp. rate 18, weight 89.359 kg (197 lb), SpO2 95.00%.Body mass index is 27.86 kg/(m^2).   General Appearance: Casual   Eye Contact:: Good   Speech: Clear and Coherent   Volume: Normal   Mood: Irritable   Affect: Labile and angry   Thought Process: Coherent   Orientation: Full (Time, Place, and Person)   Thought Content: Negative   Suicidal Thoughts: No   Homicidal Thoughts: No   Memory: Immediate; Good  Recent; Good  Remote; Good   Judgement: Poor   Insight: Shallow   Psychomotor Activity: Normal   Concentration: Good   Recall: Good   Fund of Knowledge:Good   Language: Good   Akathisia: Negative   Handed: Right   AIMS (if indicated):   Assets: Communication Skills  Desire for Improvement   Sleep:   Musculoskeletal:  Strength & Muscle Tone: within normal limits  Gait & Station: normal  Patient leans: N/A  Mental Status Per Nursing Assessment::   On Admission:     Current Mental Status by Physician: Patient denies suicidal/homicidal ideation, psychosis, and paranoia  Loss Factors: NA  Historical Factors: Family history of mental illness or substance abuse  Risk Reduction Factors:   Positive social support  Continued Clinical Symptoms:  Alcohol/Substance Abuse/Dependencies  Cognitive Features That Contribute To Risk:  None noted    Suicide Risk:  Minimal: No identifiable suicidal ideation.  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  Chronic back pain     AXIS IV: economic problems and chronic addiction  AXIS V: 51-60 moderate symptoms     Plan Of Care/Follow-up recommendations:  Activity:  as tolerated Diet:  as tolerated  Is patient on multiple antipsychotic therapies at discharge:  No   Has Patient had three or more failed trials of antipsychotic monotherapy by history:  No  Recommended Plan for Multiple Antipsychotic Therapies: NA    Kaevon Cotta, FNP-BC 11/17/2013, 2:03 PM

## 2013-11-17 NOTE — Consult Note (Signed)
  Review of Systems  Constitutional: Positive for weight loss and malaise/fatigue.  HENT: Negative.   Eyes: Negative.   Respiratory: Negative.   Cardiovascular: Negative.   Gastrointestinal: Negative.   Genitourinary: Negative.   Musculoskeletal: Positive for myalgias.  Skin: Negative.   Neurological: Negative.   Endo/Heme/Allergies: Negative.   Psychiatric/Behavioral: Negative.    Complains of total body aches

## 2014-01-10 ENCOUNTER — Emergency Department (HOSPITAL_COMMUNITY): Payer: Medicaid Other

## 2014-01-10 ENCOUNTER — Emergency Department (HOSPITAL_COMMUNITY)
Admission: EM | Admit: 2014-01-10 | Discharge: 2014-01-11 | Disposition: A | Discharge status: Other Acute Inpatient Hospital | Payer: Medicaid Other | Attending: Emergency Medicine | Admitting: Emergency Medicine

## 2014-01-10 ENCOUNTER — Encounter (HOSPITAL_COMMUNITY): Payer: Self-pay | Admitting: Emergency Medicine

## 2014-01-10 DIAGNOSIS — Y998 Other external cause status: Secondary | ICD-10-CM | POA: Diagnosis not present

## 2014-01-10 DIAGNOSIS — Y9289 Other specified places as the place of occurrence of the external cause: Secondary | ICD-10-CM | POA: Diagnosis not present

## 2014-01-10 DIAGNOSIS — M25561 Pain in right knee: Secondary | ICD-10-CM | POA: Insufficient documentation

## 2014-01-10 DIAGNOSIS — S3992XA Unspecified injury of lower back, initial encounter: Secondary | ICD-10-CM | POA: Diagnosis not present

## 2014-01-10 DIAGNOSIS — G8929 Other chronic pain: Secondary | ICD-10-CM

## 2014-01-10 DIAGNOSIS — W19XXXA Unspecified fall, initial encounter: Secondary | ICD-10-CM | POA: Diagnosis not present

## 2014-01-10 DIAGNOSIS — R531 Weakness: Secondary | ICD-10-CM | POA: Diagnosis not present

## 2014-01-10 DIAGNOSIS — M549 Dorsalgia, unspecified: Secondary | ICD-10-CM

## 2014-01-10 DIAGNOSIS — T465X2A Poisoning by other antihypertensive drugs, intentional self-harm, initial encounter: Secondary | ICD-10-CM | POA: Diagnosis not present

## 2014-01-10 DIAGNOSIS — F329 Major depressive disorder, single episode, unspecified: Secondary | ICD-10-CM | POA: Diagnosis not present

## 2014-01-10 DIAGNOSIS — T50902A Poisoning by unspecified drugs, medicaments and biological substances, intentional self-harm, initial encounter: Secondary | ICD-10-CM

## 2014-01-10 DIAGNOSIS — Z79899 Other long term (current) drug therapy: Secondary | ICD-10-CM | POA: Insufficient documentation

## 2014-01-10 DIAGNOSIS — Y9389 Activity, other specified: Secondary | ICD-10-CM | POA: Diagnosis not present

## 2014-01-10 DIAGNOSIS — F32A Depression, unspecified: Secondary | ICD-10-CM

## 2014-01-10 LAB — COMPREHENSIVE METABOLIC PANEL
ALBUMIN: 3.3 g/dL — AB (ref 3.5–5.2)
ALT: 30 U/L (ref 0–53)
AST: 16 U/L (ref 0–37)
Alkaline Phosphatase: 70 U/L (ref 39–117)
Anion gap: 12 (ref 5–15)
BUN: 8 mg/dL (ref 6–23)
CALCIUM: 9 mg/dL (ref 8.4–10.5)
CO2: 25 mEq/L (ref 19–32)
CREATININE: 0.95 mg/dL (ref 0.50–1.35)
Chloride: 102 mEq/L (ref 96–112)
GFR calc Af Amer: >90 mL/min (ref >90)
Glucose, Bld: 218 mg/dL — ABNORMAL HIGH (ref 70–99)
Potassium: 4.1 mEq/L (ref 3.7–5.3)
Sodium: 139 mEq/L (ref 137–147)
Total Bilirubin: 0.5 mg/dL (ref 0.3–1.2)
Total Protein: 6.7 g/dL (ref 6.0–8.3)

## 2014-01-10 LAB — CBC
HCT: 42.1 % (ref 39.0–52.0)
Hemoglobin: 14.2 g/dL (ref 13.0–17.0)
MCH: 29.8 pg (ref 26.0–34.0)
MCHC: 33.7 g/dL (ref 30.0–36.0)
MCV: 88.4 fL (ref 78.0–100.0)
PLATELETS: 297 10*3/uL (ref 150–400)
RBC: 4.76 MIL/uL (ref 4.22–5.81)
RDW: 14.3 % (ref 11.5–15.5)
WBC: 7.5 10*3/uL (ref 4.0–10.5)

## 2014-01-10 LAB — CBG MONITORING, ED: Glucose-Capillary: 201 mg/dL — ABNORMAL HIGH (ref 70–99)

## 2014-01-10 LAB — SALICYLATE LEVEL: Salicylate Lvl: <2 mg/dL — ABNORMAL LOW (ref 2.8–20.0)

## 2014-01-10 LAB — ACETAMINOPHEN LEVEL

## 2014-01-10 LAB — ETHANOL

## 2014-01-10 MED ORDER — IBUPROFEN 400 MG PO TABS
600.0000 mg | ORAL_TABLET | Freq: Three times a day (TID) | ORAL | Status: DC | PRN
  Filled 2014-01-10 (×2): qty 1

## 2014-01-10 MED ORDER — LORAZEPAM 1 MG PO TABS
1.0000 mg | ORAL_TABLET | Freq: Three times a day (TID) | ORAL | Status: DC | PRN
  Administered 2014-01-11 (×2): 1 mg via ORAL
  Filled 2014-01-10 (×2): qty 1

## 2014-01-10 MED ORDER — ACETAMINOPHEN 325 MG PO TABS
650.0000 mg | ORAL_TABLET | ORAL | Status: DC | PRN
Start: 2014-01-10 — End: 2014-01-11

## 2014-01-10 MED ORDER — ONDANSETRON HCL 4 MG PO TABS: 4.0000 mg | ORAL_TABLET | Freq: Three times a day (TID) | ORAL | Status: DC | PRN

## 2014-01-10 NOTE — ED Notes (Signed)
Patient transported to X-ray 

## 2014-01-10 NOTE — ED Notes (Signed)
[facility]

## 2014-01-10 NOTE — ED Notes (Signed)
Sitter at bedside.
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
## 2014-01-10 NOTE — BH Assessment (Signed)
Reviewed notes prior to assessment. Pt took intentional overdose last night and was disappointed to wake up today. Pt presented to ED to get help with his depression, per documentation pt is medically cleared.   2320 Attempted twice to call to request cart be placed with pt for assessment, and was unable to get someone on the phone.   2326 Requested cart be placed with pt for assessment. Assessment to commence shortly.    Clista BernhardtNancy Krystan Northrop, [facility] Triage Specialist 01/10/2014 11:26 PM

## 2014-01-10 NOTE — ED Notes (Signed)
Pt placed into burgandy scrubs. Pt wanded by security. Pts belongings at nurses station. Pt monitored by blood pressure, pulse ox, and 5 lead. .Marland Kitchen

## 2014-01-10 NOTE — ED Notes (Signed)
Pt. Reports intentional overdose last night on Neurontin and clonidine. States that he has a long history with depression and changing medications. States he has not been on depression medication in years. Was taking methadone for back pain but stopped cold [place] approx 1 month ago and began having withdrawal symptoms. Reports taking clonidine and opiates from "anywhere I could get them" to help with symptoms. Overdosed last night because his children left him and states "If I can't be with my family, I don't want to live. I can't say I wouldn't do it again". Requesting help with depression and SI as well as withdrawal symptoms.

## 2014-01-10 NOTE — ED Notes (Addendum)
Pt st's he has been on Methadone for approx 1 1/2 yrs.  St's he doesn't want to be on it anymore.  Pt st's he has tried to stop taking it on his on but can't stand the withdrawals he goes through.  Pt st's he just want's to blow his brains out.  St's "I'm tired of living like this and putting my family through it"   Pt also st's he took 25 Neurontin pills and 15 Clonidine pills last night but still woke up this am.   Pt c/o pain in back and right knee due to falling today.

## 2014-01-11 ENCOUNTER — Inpatient Hospital Stay (HOSPITAL_COMMUNITY)
Admission: AD | Admit: 2014-01-11 | Discharge: 2014-01-16 | DRG: 885 | Disposition: A | Discharge status: Home / Self Care | Payer: Medicaid Other | Source: Intra-hospital | Attending: Psychiatry | Admitting: Psychiatry

## 2014-01-11 ENCOUNTER — Encounter (HOSPITAL_COMMUNITY): Payer: Self-pay | Admitting: Behavioral Health

## 2014-01-11 DIAGNOSIS — F329 Major depressive disorder, single episode, unspecified: Secondary | ICD-10-CM | POA: Diagnosis present

## 2014-01-11 DIAGNOSIS — F1123 Opioid dependence with withdrawal: Secondary | ICD-10-CM | POA: Diagnosis present

## 2014-01-11 DIAGNOSIS — F13239 Sedative, hypnotic or anxiolytic dependence with withdrawal, unspecified: Secondary | ICD-10-CM | POA: Diagnosis present

## 2014-01-11 DIAGNOSIS — F1994 Other psychoactive substance use, unspecified with psychoactive substance-induced mood disorder: Secondary | ICD-10-CM

## 2014-01-11 DIAGNOSIS — F1124 Opioid dependence with opioid-induced mood disorder: Secondary | ICD-10-CM | POA: Diagnosis present

## 2014-01-11 DIAGNOSIS — F322 Major depressive disorder, single episode, severe without psychotic features: Secondary | ICD-10-CM | POA: Diagnosis present

## 2014-01-11 DIAGNOSIS — F17220 Nicotine dependence, chewing tobacco, uncomplicated: Secondary | ICD-10-CM | POA: Diagnosis present

## 2014-01-11 DIAGNOSIS — F332 Major depressive disorder, recurrent severe without psychotic features: Secondary | ICD-10-CM | POA: Insufficient documentation

## 2014-01-11 DIAGNOSIS — S3992XA Unspecified injury of lower back, initial encounter: Secondary | ICD-10-CM | POA: Diagnosis not present

## 2014-01-11 LAB — RAPID URINE DRUG SCREEN, HOSP PERFORMED
Amphetamines: NOT DETECTED
BARBITURATES: NOT DETECTED
Benzodiazepines: POSITIVE — AB
COCAINE: NOT DETECTED
OPIATES: NOT DETECTED
Tetrahydrocannabinol: NOT DETECTED

## 2014-01-11 MED ORDER — NICOTINE POLACRILEX 2 MG MT GUM
2.0000 mg | CHEWING_GUM | OROMUCOSAL | Status: DC | PRN
  Administered 2014-01-11: 2 mg via ORAL
  Filled 2014-01-11 (×3): qty 1

## 2014-01-11 MED ORDER — GABAPENTIN 300 MG PO CAPS
300.0000 mg | ORAL_CAPSULE | Freq: Two times a day (BID) | ORAL | Status: DC
  Administered 2014-01-11: 300 mg via ORAL
  Filled 2014-01-11: qty 1

## 2014-01-11 MED ORDER — CLONIDINE HCL 0.1 MG PO TABS
0.1000 mg | ORAL_TABLET | Freq: Four times a day (QID) | ORAL | Status: DC
  Administered 2014-01-11: 0.1 mg via ORAL
  Filled 2014-01-11: qty 1

## 2014-01-11 MED ORDER — ALUM & MAG HYDROXIDE-SIMETH 200-200-20 MG/5ML PO SUSP: 30.0000 mL | ORAL | Status: DC | PRN

## 2014-01-11 MED ORDER — DICYCLOMINE HCL 20 MG PO TABS: 20.0000 mg | ORAL_TABLET | Freq: Four times a day (QID) | ORAL | Status: DC | PRN

## 2014-01-11 MED ORDER — HYDROXYZINE HCL 25 MG PO TABS
25.0000 mg | ORAL_TABLET | Freq: Four times a day (QID) | ORAL | Status: DC | PRN
  Administered 2014-01-11: 25 mg via ORAL
  Filled 2014-01-11: qty 1

## 2014-01-11 MED ORDER — CLONAZEPAM 0.5 MG PO TABS: 1.0000 mg | ORAL_TABLET | Freq: Two times a day (BID) | ORAL | Status: DC

## 2014-01-11 MED ORDER — ACETAMINOPHEN 325 MG PO TABS
650.0000 mg | ORAL_TABLET | Freq: Four times a day (QID) | ORAL | Status: DC | PRN
  Administered 2014-01-11 – 2014-01-15 (×3): 650 mg via ORAL
  Filled 2014-01-11 (×3): qty 2

## 2014-01-11 MED ORDER — METHOCARBAMOL 500 MG PO TABS
500.0000 mg | ORAL_TABLET | Freq: Three times a day (TID) | ORAL | Status: DC | PRN
  Filled 2014-01-11: qty 1

## 2014-01-11 MED ORDER — MAGNESIUM HYDROXIDE 400 MG/5ML PO SUSP: 30.0000 mL | Freq: Every day | ORAL | Status: DC | PRN

## 2014-01-11 MED ORDER — OXYCODONE HCL 5 MG PO TABS
10.0000 mg | ORAL_TABLET | Freq: Four times a day (QID) | ORAL | Status: DC | PRN
  Administered 2014-01-11 (×2): 10 mg via ORAL
  Filled 2014-01-11 (×2): qty 2

## 2014-01-11 MED ORDER — CHLORDIAZEPOXIDE HCL 25 MG PO CAPS
25.0000 mg | ORAL_CAPSULE | Freq: Four times a day (QID) | ORAL | Status: DC | PRN
  Administered 2014-01-11 – 2014-01-12 (×2): 25 mg via ORAL
  Filled 2014-01-11 (×2): qty 1

## 2014-01-11 MED ORDER — GABAPENTIN 600 MG PO TABS
300.0000 mg | ORAL_TABLET | Freq: Two times a day (BID) | ORAL | Status: DC
  Filled 2014-01-11 (×2): qty 0.5

## 2014-01-11 MED ORDER — ONDANSETRON 4 MG PO TBDP: 4.0000 mg | ORAL_TABLET | Freq: Four times a day (QID) | ORAL | Status: DC | PRN

## 2014-01-11 MED ORDER — CLONIDINE HCL 0.1 MG PO TABS: 0.1000 mg | ORAL_TABLET | ORAL | Status: DC

## 2014-01-11 MED ORDER — TRAZODONE HCL 100 MG PO TABS
100.0000 mg | ORAL_TABLET | Freq: Every evening | ORAL | Status: DC | PRN
  Filled 2014-01-11: qty 1

## 2014-01-11 MED ORDER — LOPERAMIDE HCL 2 MG PO CAPS: 2.0000 mg | ORAL_CAPSULE | ORAL | Status: DC | PRN

## 2014-01-11 MED ORDER — NICOTINE POLACRILEX 2 MG MT GUM
2.0000 mg | CHEWING_GUM | OROMUCOSAL | Status: DC | PRN
  Administered 2014-01-11 – 2014-01-16 (×13): 2 mg via ORAL
  Filled 2014-01-11 (×12): qty 1

## 2014-01-11 MED ORDER — CLONIDINE HCL 0.1 MG PO TABS: 0.1000 mg | ORAL_TABLET | Freq: Every day | ORAL | Status: DC

## 2014-01-11 MED ORDER — HYDROXYZINE HCL 25 MG PO TABS
25.0000 mg | ORAL_TABLET | Freq: Four times a day (QID) | ORAL | Status: DC | PRN
  Administered 2014-01-11 (×2): 25 mg via ORAL
  Filled 2014-01-11 (×2): qty 1

## 2014-01-11 MED ORDER — NAPROXEN 250 MG PO TABS: 500.0000 mg | ORAL_TABLET | Freq: Two times a day (BID) | ORAL | Status: DC | PRN

## 2014-01-11 MED ORDER — PANTOPRAZOLE SODIUM 40 MG PO TBEC
40.0000 mg | DELAYED_RELEASE_TABLET | Freq: Every day | ORAL | Status: DC
  Administered 2014-01-11: 40 mg via ORAL
  Filled 2014-01-11: qty 1

## 2014-01-11 NOTE — BH Assessment (Signed)
Inpt recommended, no [facility] beds, sent referral to the following facilities for potential placement: [facility]  Clista BernhardtNancy Shekinah Pitones, [place] Triage Specialist 01/11/2014 1:01 AM

## 2014-01-11 NOTE — ED Notes (Signed)
Spoke with Onalee [facility]avid [facility], updated him on patient condition. Per Onalee Huaavid, due to length of time since ingestion, there should not be any emergent concerns for OD S/S. Will continue to monitor patient.

## 2014-01-11 NOTE — ED Provider Notes (Signed)
Patient initially seen by Blinda LeatherwoodPollina, MD.  Patient reported overdose of Neurontin and clonidine. Reports active SI and will not contract for safety. Initially voluntary. [facility].  Patient now trying to leave after he was not given anything for pain. IVC paperwork filled out.    Shon Batonourtney F Ipek Westra, MD 01/11/14 [phone]
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
## 2014-01-11 NOTE — Tx Team (Signed)
Initial Interdisciplinary Treatment Plan   PATIENT STRESSORS: Financial difficulties Health problems Occupational concerns   PATIENT STRENGTHS: Ability for insight Capable of independent living General fund of knowledge Motivation for treatment/growth   PROBLEM LIST: Problem List/Patient Goals Date to be addressed Date deferred Reason deferred Estimated date of resolution  Suicidal Ideation 01/11/14     Depression 01/11/14                                                DISCHARGE CRITERIA:  Ability to meet basic life and health needs Improved stabilization in mood, thinking, and/or behavior Motivation to continue treatment in a less acute level of care  PRELIMINARY DISCHARGE PLAN: Attend PHP/IOP Outpatient therapy Return to previous living arrangement  PATIENT/FAMIILY INVOLVEMENT: This treatment plan has been presented to and reviewed with the patient, Gailen ShelterLonnie R Posch.  The patient and family have been given the opportunity to ask questions and make suggestions.  Harold BarbanByrd, Malayja Freund E 01/11/2014, 5:04 PM

## 2014-01-11 NOTE — ED Notes (Signed)
[employer] present to serve papers at this time.

## 2014-01-11 NOTE — Progress Notes (Signed)
Admission Note  D: [facility] Patient presents with sad, depressed affect and mood. He voiced that he's here due to an overdose attempt on clonidine and neurontin; pt. stated, "he didn't want to be here anymore". However, he now verbalizes that he knows that was stupid and he really misses his kids, who are daughters age 705 and 9116 and son 43 years of age.  A: Support and encouragement provided to patient. Oriented patient to the unit and informed him of the hospital's rules/policies. Initiated Q15 minute checks for safety.  R: Patient receptive. Denies SI/HI and AVH at this time. Patient remains safe on the unit.

## 2014-01-11 NOTE — ED Notes (Signed)
Pt continues to call out for pain medication, reinforced with Dr Wilkie AyeHorton that that acetaminophen or ibuprofen are available options. Pt verbally expressing frustration with stay and quality of food in ED. Pt assured that breakfast will be coming at 0700.

## 2014-01-11 NOTE — ED Notes (Signed)
[facility]

## 2014-01-11 NOTE — ED Notes (Signed)
gpd called to transport 

## 2014-01-11 NOTE — BH Assessment (Signed)
Relayed assessment results to Donell SievertSpencer Simon, PA. Per Donell SievertSpencer Simon, PA pt meets inpt criteria. No [facility] beds available TTS to seek placement.   Discussed recommendations with Dr. Blinda LeatherwoodPollina who is in agreement.   Informed RN of plan to seek placement and she will inform pt.    Clista BernhardtNancy Ruhan Borak, [facility] Triage Specialist 01/11/2014 12:20 AM
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
## 2014-01-11 NOTE — ED Notes (Signed)
Pt called out stating he'd like something for back pain, states its chronic, when asked to rate it, patient states, "its up there." Pt informed that he would not be receiving narcotics due to request for narcotic detox and overdose on 01/09/14. Pt states, "nevermind, can you go get me my clothes, I don't need to be here." EDP notified, Dr. Wilkie AyeHorton to IVC at this time.

## 2014-01-11 NOTE — Progress Notes (Signed)
[facility] accepting Dr. Jama Flavorsobos. Pod C RN aware. Pt. IVC'd.  Marcus [name], MSW  Social Worker [phone]

## 2014-01-12 DIAGNOSIS — F1123 Opioid dependence with withdrawal: Secondary | ICD-10-CM

## 2014-01-12 DIAGNOSIS — F13239 Sedative, hypnotic or anxiolytic dependence with withdrawal, unspecified: Secondary | ICD-10-CM

## 2014-01-12 MED ORDER — LOPERAMIDE HCL 2 MG PO CAPS: 2.0000 mg | ORAL_CAPSULE | ORAL | Status: DC | PRN

## 2014-01-12 MED ORDER — THIAMINE HCL 100 MG/ML IJ SOLN
100.0000 mg | Freq: Once | INTRAMUSCULAR | Status: AC
  Administered 2014-01-12: 100 mg via INTRAMUSCULAR
  Filled 2014-01-12: qty 2

## 2014-01-12 MED ORDER — METHOCARBAMOL 500 MG PO TABS
500.0000 mg | ORAL_TABLET | Freq: Three times a day (TID) | ORAL | Status: DC | PRN
  Filled 2014-01-12: qty 20

## 2014-01-12 MED ORDER — VITAMIN B-1 100 MG PO TABS
100.0000 mg | ORAL_TABLET | Freq: Every day | ORAL | Status: DC
  Administered 2014-01-13 – 2014-01-16 (×4): 100 mg via ORAL
  Filled 2014-01-12 (×5): qty 1

## 2014-01-12 MED ORDER — LORAZEPAM 1 MG PO TABS
1.0000 mg | ORAL_TABLET | Freq: Four times a day (QID) | ORAL | Status: AC | PRN
  Administered 2014-01-13 – 2014-01-14 (×4): 1 mg via ORAL
  Filled 2014-01-12 (×4): qty 1

## 2014-01-12 MED ORDER — HALOPERIDOL LACTATE 5 MG/ML IJ SOLN
5.0000 mg | Freq: Once | INTRAMUSCULAR | Status: AC
  Administered 2014-01-12: 5 mg via INTRAMUSCULAR
  Filled 2014-01-12 (×2): qty 1

## 2014-01-12 MED ORDER — LORAZEPAM 2 MG/ML IJ SOLN
2.0000 mg | Freq: Once | INTRAMUSCULAR | Status: AC
  Administered 2014-01-12: 2 mg via INTRAMUSCULAR
  Filled 2014-01-12: qty 1

## 2014-01-12 MED ORDER — GABAPENTIN 400 MG PO CAPS
400.0000 mg | ORAL_CAPSULE | Freq: Three times a day (TID) | ORAL | Status: DC
  Administered 2014-01-12 – 2014-01-16 (×13): 400 mg via ORAL
  Filled 2014-01-12 (×6): qty 1
  Filled 2014-01-12: qty 42
  Filled 2014-01-12: qty 1
  Filled 2014-01-12: qty 42
  Filled 2014-01-12 (×2): qty 1
  Filled 2014-01-12: qty 42
  Filled 2014-01-12 (×7): qty 1

## 2014-01-12 MED ORDER — HYDROXYZINE HCL 25 MG PO TABS
25.0000 mg | ORAL_TABLET | Freq: Four times a day (QID) | ORAL | Status: DC | PRN
  Administered 2014-01-12 – 2014-01-15 (×3): 25 mg via ORAL
  Filled 2014-01-12: qty 1
  Filled 2014-01-12: qty 20
  Filled 2014-01-12 (×3): qty 1

## 2014-01-12 MED ORDER — NAPROXEN 500 MG PO TABS
500.0000 mg | ORAL_TABLET | Freq: Two times a day (BID) | ORAL | Status: DC | PRN
  Administered 2014-01-12 (×2): 500 mg via ORAL
  Filled 2014-01-12 (×2): qty 1

## 2014-01-12 MED ORDER — ENSURE COMPLETE PO LIQD
237.0000 mL | Freq: Two times a day (BID) | ORAL | Status: DC
  Administered 2014-01-12 – 2014-01-15 (×6): 237 mL via ORAL

## 2014-01-12 MED ORDER — ADULT MULTIVITAMIN W/MINERALS CH
1.0000 | ORAL_TABLET | Freq: Every day | ORAL | Status: DC
  Administered 2014-01-12 – 2014-01-16 (×5): 1 via ORAL
  Filled 2014-01-12 (×7): qty 1

## 2014-01-12 MED ORDER — LORAZEPAM 1 MG PO TABS
1.0000 mg | ORAL_TABLET | Freq: Four times a day (QID) | ORAL | Status: AC
Start: 2014-01-12 — End: 2014-01-13
  Administered 2014-01-12 – 2014-01-13 (×4): 1 mg via ORAL
  Filled 2014-01-12 (×4): qty 1

## 2014-01-12 MED ORDER — DICYCLOMINE HCL 20 MG PO TABS: 20.0000 mg | ORAL_TABLET | Freq: Four times a day (QID) | ORAL | Status: DC | PRN

## 2014-01-12 MED ORDER — ONDANSETRON 4 MG PO TBDP: 4.0000 mg | ORAL_TABLET | Freq: Four times a day (QID) | ORAL | Status: DC | PRN

## 2014-01-12 MED ORDER — CLONIDINE HCL 0.1 MG PO TABS
0.1000 mg | ORAL_TABLET | Freq: Four times a day (QID) | ORAL | Status: AC
  Administered 2014-01-12 – 2014-01-14 (×8): 0.1 mg via ORAL
  Filled 2014-01-12 (×10): qty 1

## 2014-01-12 MED ORDER — LORAZEPAM 1 MG PO TABS
1.0000 mg | ORAL_TABLET | Freq: Two times a day (BID) | ORAL | Status: AC
Start: 2014-01-14 — End: 2014-01-15
  Administered 2014-01-14 – 2014-01-15 (×2): 1 mg via ORAL
  Filled 2014-01-12 (×2): qty 1

## 2014-01-12 MED ORDER — LORAZEPAM 1 MG PO TABS
1.0000 mg | ORAL_TABLET | Freq: Every day | ORAL | Status: AC
  Administered 2014-01-16: 1 mg via ORAL
  Filled 2014-01-12: qty 1

## 2014-01-12 MED ORDER — LORAZEPAM 1 MG PO TABS
1.0000 mg | ORAL_TABLET | Freq: Three times a day (TID) | ORAL | Status: AC
  Administered 2014-01-13 – 2014-01-14 (×3): 1 mg via ORAL
  Filled 2014-01-12 (×3): qty 1

## 2014-01-12 MED ORDER — HYDROXYZINE HCL 50 MG PO TABS
50.0000 mg | ORAL_TABLET | Freq: Every evening | ORAL | Status: DC | PRN
  Administered 2014-01-12 – 2014-01-15 (×6): 50 mg via ORAL
  Filled 2014-01-12 (×13): qty 1

## 2014-01-12 MED ORDER — VENLAFAXINE HCL ER 75 MG PO CP24
75.0000 mg | ORAL_CAPSULE | Freq: Every day | ORAL | Status: DC
  Administered 2014-01-13 – 2014-01-16 (×4): 75 mg via ORAL
  Filled 2014-01-12 (×4): qty 1
  Filled 2014-01-12: qty 14
  Filled 2014-01-12: qty 1

## 2014-01-12 MED ORDER — CLONIDINE HCL 0.1 MG PO TABS
0.1000 mg | ORAL_TABLET | Freq: Every day | ORAL | Status: DC
  Filled 2014-01-12: qty 1

## 2014-01-12 MED ORDER — CLONIDINE HCL 0.1 MG PO TABS
0.1000 mg | ORAL_TABLET | ORAL | Status: AC
  Administered 2014-01-14 – 2014-01-16 (×4): 0.1 mg via ORAL
  Filled 2014-01-12 (×4): qty 1

## 2014-01-12 NOTE — Progress Notes (Signed)
D: Pt in the room lying down during our initial interaction. Pt presented with a blunted affect. Writer introduced self to pt and invited him to go to group. Pt verbalizes that he was trying to get some rest for now as he was just recently admitted. Pt was informed that he had no current scheduled medications but he had prn orders for any withdrawal symptoms, anxiety, and/or sleep. Pt was not satisfied with his orders and demanded to sign a 72 hour request for discharge. Pt denied any SI/HI. Pt would not answer this writer's question about any active withdrawals. Pt informed that his status is to be verified before the signing of this requested form. Pt [facility] and this Clinical research associatewriter that his status is "Involuntarily".  Pt accepting of this information. Pt later verbalized that he wanted to take the available medications.  A: Pt was administered Librium, Vistaril, and Tylenol, per MD orders. Medications indications verbalized.Continued support and availability as needed was extended to this pt. R. Pt remains irritable and unsatisfied with his current medication regimen. On-call extender has referred pt to speak with his psychiatrist in the a.m. about any med changes. Writer reienterated to pt that the Librium was for his withdrawals as prescribed. Pt states that he don't recall going through this during his past 8 visits. Pt informed that this facility has been using Librium and Ativan for Benzo withdrawals and clonidine for opiate withdrawal for at least the past couple of years. Pt requested Ativan and was again referred to the previous statement mentioned above from the extender. Pt was also informed that Ativan was commonly ordered for detoxing pt's with elevated liver enzyme levels. Pt was encouraged to follow-up with the physician about any med request changes.

## 2014-01-12 NOTE — Progress Notes (Signed)
Recreation Therapy Notes  Animal-Assisted Activity/Therapy (AAA/T) Program Checklist/Progress Notes Patient Eligibility Criteria Checklist & Daily Group note for Rec Tx Intervention  Date: 11.19.2015 Time: 2:45pm Location: [address] Dayroom    AAA/T Program Assumption of Risk Form signed by Patient/ or Parent Legal Guardian yes  Patient is free of allergies or sever asthma yes  Patient reports no fear of animals yes  Patient reports no history of cruelty to animals yes   Patient understands his/her participation is voluntary yes  Patient washes hands before animal contact yes  Patient washes hands after animal contact yes  Behavioral Response: Appropriate   Education: Hand Washing, Appropriate Animal Interaction   Education Outcome: Acknowledges education.   Clinical Observations/Feedback: Patient engaged in session, petting therapy dog appropriately.  Marcus [name] L Marcus [name], Marcus [name]  Marcus [name] L 01/12/2014 4:21 PM 

## 2014-01-12 NOTE — [facility] Suicide Risk Assessment (Signed)
   Nursing information obtained from:    Demographic factors:   43 year old man, currently unemployed, married Current Mental Status:   See below Loss Factors:   unemployment Historical Factors:   Depression, history of opiate dependence Risk Reduction Factors:   sense of responsibility to family Total Time spent with patient: 45 minutes  CLINICAL FACTORS:  Depression, opiate dependence, opiate withdrawal, suicide gesture  Psychiatric Specialty Exam: Physical Exam  ROS  Blood pressure 121/84, pulse 86, temperature 97.8 F (36.6 C), temperature source Oral, resp. rate 18, height 5\' 10"  (1.778 m), weight 89.926 kg (198 lb 4 oz).Body mass index is 28.45 kg/(m^2).  SEE ADMIT NOTE MSE   COGNITIVE FEATURES THAT CONTRIBUTE TO RISK:  Closed-mindedness    SUICIDE RISK:   Moderate:  Frequent suicidal ideation with limited intensity, and duration, some specificity in terms of plans, no associated intent, good self-control, limited dysphoria/symptomatology, some risk factors present, and identifiable protective factors, including available and accessible social support.  PLAN OF CARE: Patient will be admitted to inpatient psychiatric unit for stabilization and safety. Will provide and encourage milieu participation. Provide medication management and maked adjustments as needed. Will also provide medication management to decrease risk of opiate or BZD withdrawal.  Will follow daily.    I certify that inpatient services furnished can reasonably be expected to improve the patient's condition.  Preslea Rhodus 01/12/2014, 12:43 PM

## 2014-01-12 NOTE — Progress Notes (Signed)
NUTRITION ASSESSMENT  Pt identified as at risk on the Malnutrition Screen Tool  INTERVENTION: 1. Educated patient on the importance of nutrition and encouraged intake of food and beverages. 2. Discussed weight goals. 3. Supplements: Ensure Complete po BID, each supplement provides 350 kcal and 13 grams of protein  NUTRITION DIAGNOSIS: Unintentional weight loss related to sub-optimal intake as evidenced by per MD note, 45 lb weight loss.  Goal: Pt to meet >/= 90% of their estimated nutrition needs.  Monitor:  PO intake  Assessment:  Pt admitted with depression, substance abuse and suicide attempt.  Attempted to obtain history from pt but pt was lying in bed. Pt was unable to recall if he has had some weight loss or what his UBW is.  Per MD note, pt has had 45 lb weight loss over the last 2 months.  Discussed with pt the importance of eating 3 meals a day with snacks, emphasizing protein consumption. Discussed the importance of good nutrition for mental health and aiding in recovery.   Pt was willing to try Ensure supplements, RD will order BID.  Height: Ht Readings from Last 1 Encounters:  01/11/14 5\' 10"  (1.778 m)    Weight: Wt Readings from Last 1 Encounters:  01/11/14 198 lb 4 oz (89.926 kg)    Weight Hx: Wt Readings from Last 10 Encounters:  01/11/14 198 lb 4 oz (89.926 kg)  11/16/13 197 lb (89.359 kg)  05/19/12 205 lb (92.987 kg)  06/18/11 225 lb (102.059 kg)    BMI:  Body mass index is 28.45 kg/(m^2). Pt meets criteria for overweight based on current BMI.  Estimated Nutritional Needs: Kcal: 25-30 kcal/kg Protein: > 1 gram protein/kg Fluid: 1 ml/kcal  Diet Order: Diet regular Pt is also offered choice of unit snacks mid-morning and mid-afternoon.  Pt is eating as desired.   Lab results and medications reviewed.   Tilda FrancoLindsey Elisse Pennick, MS, RD, LDN Pager: [phone] After Hours Pager: [phone]  '
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
## 2014-01-12 NOTE — Progress Notes (Signed)
Pt did not attend NA group this evening.  

## 2014-01-12 NOTE — Significant Event (Cosign Needed)
Notified by nursing staff patient extremely anxious, rating his anxiety sx a 10/10. Patient is also endorsing CP which he states correlates with his anxiety sx Patient is denying nay hx of known CAD, prior MI, [place], CM, CHF, hx TIA or CVA. Patient is denying any SOB, near syncope or N/V. The CP is non reproducible and without radiation. Patient is endorsing his current stress level as a aggravating factor. Patient is admitted with poly-substance induced mood d/o. Patient reportedly O/D on Clonidine and Neurontin pre hospital. Patient states he takes Klonopin at home prn.  UDS is positive for benzodiazepines, other wise negative. Alcohol level is negative.  V/S: BP 113/69, HR 64, Sat 100%  Gen: A & O x 3 spheres in NAD  Integument: W/D non clammy or diaphoretic.  HEENT: PERRL bilat  Pulm: CTA  Cardio: Audible S1,S2 without M/G/R  A/P: Benzodiazepine dependence and W/D  Ativan 2 MG IM x one and Haldol 5 MG IM x one given

## 2014-01-12 NOTE — [facility] Suicide Risk Assessment (Signed)
[facility] INPATIENT:  Family/Significant Other Suicide Prevention Education  Suicide Prevention Education:  Patient Refusal for Family/Significant Other Suicide Prevention Education: The patient Marcus [name] has refused to provide written consent for family/significant other to be provided Family/Significant Other Suicide Prevention Education during admission and/or prior to discharge.  Physician notified.  Wynn BankerHodnett, Dominie Benedick Hairston 01/12/2014, 3:27 PM

## 2014-01-12 NOTE — [facility] Group Notes (Signed)
[facility] LCSW Group Therapy  [facility] 1:15 - 2:30 PM  01/12/2014 3:19 PM   Type of Therapy:  Group Therapy  Participation Level:  Did not attend - in bed.  Roma Bierlein, 912 Clinton DriveQuylle Hairston  Juline PatchHodnett, Misheel Gowans Hairston 01/12/2014, 3:19 PM

## 2014-01-12 NOTE — Progress Notes (Signed)
Patient ID: Marcus [name], male   DOB: 05-May-1970, 43 y.o.   MRN: [id number]  D: Pt was initially quite irritable this a.m. He denied SI and a/v disturbances. He said he was in a lot of pain and "feeling terrible" due to not taking Neurontin. He said he had been taking his wife's prescription for Neurontin. He remained in bed for much of the a.m.  A: Medications given as ordered. Emotional support offered.   R: Pt was able to speak with MD and get his medication questions answered. After receiving Neurontin and other scheduled meds, his affect brightened and he appeared to feel somewhat better.
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
## 2014-01-13 LAB — HEMOGLOBIN A1C
HEMOGLOBIN A1C: 5.6 % (ref <5.7)
Mean Plasma Glucose: 114 mg/dL (ref <117)

## 2014-01-13 NOTE — Progress Notes (Signed)
D: Pt reports that he is doing better with the change of his meds from admission. Pt appeared less anxious and irritable than his first night here. Pt reports that he was a "total mess". Writer was empathetic with this pt. Pt had increased interaction with others on the unit. However, pt did not attend karaoke this evening. Pt is calm and cooperative in his current plan of care. Pt is negative for any SI/HI/AVH. A: Writer administered scheduled and prn medications to pt, per MD orders. Continued support and availability as needed was extended to this pt. Staff continue to monitor pt with q3715min checks.  R: No adverse drug reactions noted. Pt receptive to treatment. Pt remains safe at this time.

## 2014-01-13 NOTE — Tx Team (Signed)
Interdisciplinary Treatment Plan Update   Date Reviewed:  01/13/2014  Time Reviewed:  8:25 AM  Progress in Treatment:   Attending groups: Yes Participating in groups: Yes Taking medication as prescribed: Yes  Tolerating medication: Yes Family/Significant other contact made:  No, but will ask patient for consent for collateral contact Patient understands diagnosis: Yes  Discussing patient identified problems/goals with staff: Yes Medical problems stabilized or resolved: Yes Denies suicidal/homicidal ideation: Yes Patient has not harmed self or others: Yes  For review of initial/current patient goals, please see plan of care.  Estimated Length of Stay: 3-4 days    Reasons for Continued Hospitalization:  Anxiety Depression Medication stabilization  New Problems/Goals identified:    Discharge Plan or Barriers:   Home with outpatient follow up to be determined  Additional Comments:   Continue medication stabilization  Patient and CSW reviewed patient's identified goals and treatment plan.  Patient verbalized understanding and agreed to treatment plan.   Attendees:  Patient:  01/13/2014 8:25 AM   Signature:  Sallyanne HaversF. Cobos, MD 01/13/2014 8:25 AM  ignature: 01/13/2014 8:25 AM  Signature:  Harold Barbanonecia Byrd, RN 01/13/2014 8:25 AM  Signature: Ellison HughsElana Payne, Pharmacist 01/13/2014 8:25 AM  Signature:   01/13/2014 8:25 AM  Signature:  Juline PatchQuylle Telecia Larocque, LCSW 01/13/2014 8:25 AM  Signature:  Belenda CruiseKristin Drinkard, LCSW-A 01/13/2014 8:25 AM  Signature:  Leisa LenzValerie Enoch, Care Coordinator [facility] 01/13/2014 8:25 AM  Signature:   01/13/2014 8:25 AM  Signature: Michaelle BirksBritney Guthrie, RN 01/13/2014  8:25 AM  Signature:   01/13/2014  8:25 AM  Signature:   01/13/2014  8:25 AM    Scribe for Treatment Team:   Juline PatchQuylle Avarie Tavano,  01/13/2014 8:25 AM

## 2014-01-13 NOTE — [facility] Group Notes (Signed)
[facility] LCSW Group Therapy  Feelings Around Relapse 1:15 -2:30        01/13/2014 2:59 PM   Type of Therapy:  Group Therapy  Participation Level:  Appropriate  Participation Quality:  Appropriate  Affect: Appropriate  Cognitive:  Attentive Appropriate  Insight:  Developing/Improving  Engagement in Therapy: Developing/Improving  Modes of Intervention:  Discussion Exploration Problem-Solving Supportive  Summary of Progress/Problems:  The topic for today was feelings around relapse.  Patient processed feelings toward relapse and was able to relate to peers. He shared relapse for him would be using drugs again. Patient identified coping skills that can be used to prevent a relapse including getting more involved with his church and a group of men there who have recovered from alcohol/drugs.  Patient encouraged to make sure he follows up with outpatient referral for medications and counseling.   Wynn BankerHodnett, Tamecka Milham Hairston 01/13/2014 2:59 PM

## 2014-01-13 NOTE — Progress Notes (Signed)
D: Patient presents today with depressed affect and anxious mood. He reported on the self inventory sheet that he's sleeping poorly, normal energy level and good appetite and ability to concentrate. Patient rated depression/feelings of hopelessness "5" and anxiety "9". He's attending the group sessions throughout the day; also earlier he met with his pastor for a few minutes. Patient adheres to medication regimen.  A: Support and encouragement provided to patient. Administered scheduled medications per ordering MD. Monitor Q15 minute checks for safety.  R: Patient receptive. Denies SI/HI and AVH. Patient remains safe on the hall.

## 2014-01-13 NOTE — Progress Notes (Signed)
[facility] MD Progress Note  01/13/2014 11:13 AM Marcus [name]  MRN:  [id number] Subjective:  Patient states he is feeling significantly better today- less anxious, less depressed. At this time he is starting to focus on going home soon. Objective: I have discussed case with treatment team and met with patient. Patient seems significantly improved compared to yesterday- he appears calmer, less restless, less agitated, and in no acute distress and with no psychomotor restlessness today. He is endorsing some vague aches/pains and rhinorrhea as ongoing withdrawal symptoms, but denies vomiting , diarrhea, or severe symptoms at this time. His vitals are stable. He is not exhibiting any disruptive behaviors in the clinic. He has been going to groups and is visible on unit. At this time not endorsing any medication side effects. Thus far tolerating Effexor XR trial well. Diagnosis:  Major Depression, severe, no psychotic symptoms, Opiate Dependence, Benzodiazepine Dependence, Opiate withdrawal, Benzodiazepine Withdrawal  Total Time spent with patient: 25 minutes    ADL's:  improving  Sleep: poor   Appetite:improving   Suicidal Ideation:  At this time denies suicidal ideations Homicidal Ideation:  At this time denies homicidal ideations AEB (as evidenced by):  Psychiatric Specialty Exam: Physical Exam  ROS  Blood pressure 115/75, pulse 68, temperature 97.9 F (36.6 C), temperature source Oral, resp. rate 16, height 5' 10"  (1.778 m), weight 89.926 kg (198 lb 4 oz).Body mass index is 28.45 kg/(m^2).  General Appearance: Fairly Groomed  Engineer, water::  Good  Speech:  Normal Rate  Volume:  Normal  Mood:  improved, also presenting with less anxious, fuller range of affect  Affect:  more reactive, less anxious  Thought Process:  Linear  Orientation:  Other:  fully alert and attentive  Thought Content:  no hallucinations, no delusions  Suicidal Thoughts:  No at this time denies any  thoughts of hurting self and  contracts for safety on unit   Homicidal Thoughts:  No  Memory: recent and remote grossly intact   Judgement:  Fair  Insight:  Fair  Psychomotor Activity:  improved, calmer, less restless  Concentration:  Good  Recall:  Good  Fund of Knowledge:Good  Language: Good  Akathisia:  Negative  Handed:  Right  AIMS (if indicated):     Assets:  Desire for Improvement Housing Resilience  Sleep:  Number of Hours: 0.75   Musculoskeletal: Strength & Muscle Tone: within normal limits - at this time no significant tremors, no diaphoresis, no acute distress or discomfort noted. Gait & Station: normal Patient leans: N/A  Current Medications: Current Facility-Administered Medications  Medication Dose Route Frequency Provider Last Rate Last Dose  . acetaminophen (TYLENOL) tablet 650 mg  650 mg Oral Q6H PRN Elmarie Shiley, NP   650 mg at 01/13/14 0436  . alum & mag hydroxide-simeth (MAALOX/MYLANTA) 200-200-20 MG/5ML suspension 30 mL  30 mL Oral Q4H PRN Elmarie Shiley, NP      . cloNIDine (CATAPRES) tablet 0.1 mg  0.1 mg Oral QID Jenne Campus, MD   0.1 mg at 01/13/14 0802   Followed by  . [START ON 01/14/2014] cloNIDine (CATAPRES) tablet 0.1 mg  0.1 mg Oral BH-qamhs Jenne Campus, MD       Followed by  . [START ON 01/17/2014] cloNIDine (CATAPRES) tablet 0.1 mg  0.1 mg Oral QAC breakfast Myer Peer Cobos, MD      . dicyclomine (BENTYL) tablet 20 mg  20 mg Oral Q6H PRN Jenne Campus, MD      . feeding  supplement (ENSURE COMPLETE) (ENSURE COMPLETE) liquid 237 mL  237 mL Oral BID BM Clayton Bibles, RD   237 mL at 01/13/14 1058  . gabapentin (NEURONTIN) capsule 400 mg  400 mg Oral TID Jenne Campus, MD   400 mg at 01/13/14 0805  . hydrOXYzine (ATARAX/VISTARIL) tablet 25 mg  25 mg Oral Q6H PRN Jenne Campus, MD   25 mg at 01/12/14 1507  . hydrOXYzine (ATARAX/VISTARIL) tablet 50 mg  50 mg Oral QHS,MR X 1 Spencer E Simon, PA-C   50 mg at 01/13/14 0144  . loperamide  (IMODIUM) capsule 2-4 mg  2-4 mg Oral PRN Jenne Campus, MD      . LORazepam (ATIVAN) tablet 1 mg  1 mg Oral Q6H PRN Myer Peer Cobos, MD      . LORazepam (ATIVAN) tablet 1 mg  1 mg Oral TID Jenne Campus, MD       Followed by  . [START ON 01/14/2014] LORazepam (ATIVAN) tablet 1 mg  1 mg Oral BID Jenne Campus, MD       Followed by  . [START ON 01/16/2014] LORazepam (ATIVAN) tablet 1 mg  1 mg Oral Daily Fernando A Cobos, MD      . magnesium hydroxide (MILK OF MAGNESIA) suspension 30 mL  30 mL Oral Daily PRN Elmarie Shiley, NP      . methocarbamol (ROBAXIN) tablet 500 mg  500 mg Oral Q8H PRN Jenne Campus, MD      . multivitamin with minerals tablet 1 tablet  1 tablet Oral Daily Jenne Campus, MD   1 tablet at 01/13/14 0802  . naproxen (NAPROSYN) tablet 500 mg  500 mg Oral BID PRN Jenne Campus, MD   500 mg at 01/12/14 2140  . nicotine polacrilex (NICORETTE) gum 2 mg  2 mg Oral PRN Jenne Campus, MD   2 mg at 01/13/14 0843  . ondansetron (ZOFRAN-ODT) disintegrating tablet 4 mg  4 mg Oral Q6H PRN Jenne Campus, MD      . thiamine (VITAMIN B-1) tablet 100 mg  100 mg Oral Daily Jenne Campus, MD   100 mg at 01/13/14 0802  . venlafaxine XR (EFFEXOR-XR) 24 hr capsule 75 mg  75 mg Oral Q breakfast Jenne Campus, MD   75 mg at 01/13/14 2542    Lab Results: No results found for this or any previous visit (from the past 59 hour(s)).  Physical Findings: AIMS:  , ,  ,  ,    CIWA:  CIWA-Ar Total: 1 COWS:  COWS Total Score: 3   Assessment: Patient is improved compared to yesterday and at this time is not presenting with any severe opiate or BZD withdrawal symptoms and is tolerating detox well. He presents calmer, less restless and states he feels better. He does have some rhinorrhea and muscular aches related to WDL.   Treatment Plan Summary: Daily contact with patient to assess and evaluate symptoms and progress in treatment Medication management See  below  Plan: Continue inpatient treatment and support  Continue Clonidine taper as per opiate detox protocol Continue Ativan taper as per CIWA  Detox protocol Continue Neurontin 400 mgrs TID Continue Effexor XR 75 mgrs QDAY   Medical Decision Making Problem Points:  Established problem, stable/improving (1), Review of last therapy session (1) and Review of psycho-social stressors (1) Data Points:  Review of medication regiment & side effects (2)  I certify that inpatient services furnished can reasonably be expected  to improve the patient's condition.   COBOS, FERNANDO 01/13/2014, 11:13 AM

## 2014-01-13 NOTE — [facility] Group Notes (Signed)
[facility] Group Notes:  activities  Date:  01/13/2014  Time:  10:59 AM  Type of Therapy:  Psychoeducational Skills  Participation Level:  Active  Participation Quality:  Appropriate  Affect:  Appropriate  Cognitive:  Appropriate  Insight:  Appropriate  Engagement in Group:  Engaged  Modes of Intervention:  Discussion  Summary of Progress/Problems:  Nicole CellaWebb, Anderson Middlebrooks Guyes 01/13/2014, 10:59 AM

## 2014-01-13 NOTE — [facility] Group Notes (Signed)
Adult Psychoeducational Group Note  Date:  01/13/2014 Time:  9:57 PM  Group Topic/Focus:  AA Meeting  Participation Level:  Active  Participation Quality:  Appropriate  Affect:  Appropriate  Cognitive:  Appropriate  Insight: Appropriate  Engagement in Group:  Engaged  Modes of Intervention:  Discussion and Education  Additional Comments:  Marcus HarborLonnie was very engaged in group.  He gave a lot of detail as to why he is here and his plans when he leaves.  Marcus RancherLindsay, Marcus [name] A 01/13/2014, 9:57 PM

## 2014-01-13 NOTE — [facility] Group Notes (Signed)
[facility] Note   01/13/2014 8:45 AM    Participation Quality:  Appropraite  Mood/Affect:  Appropriate  Depression Rating:    Anxiety Rating:    Thoughts of Suicide:  No  Will you contract for safety?   NA  Current AVH:  No  Plan for Discharge/Comments:  Patient attended discharge planning group and actively participated in group. He will return home with family and [facility]. Suicide prevention education reviewed and SPE document provided.   Transportation Means: Patient has transportation.   Supports:  Patient has a support system.   Marcus [name], Marcus [name]

## 2014-01-14 DIAGNOSIS — F332 Major depressive disorder, recurrent severe without psychotic features: Secondary | ICD-10-CM

## 2014-01-14 NOTE — Progress Notes (Signed)
Adult Psychoeducational Group Note  Date:  01/14/2014 Time:  2:20 PM  Group Topic/Focus:  Therapeutic Activity   Participation Level:  Active  Participation Quality:  Appropriate, Attentive, Sharing and Supportive  Affect:  Appropriate  Cognitive:  Appropriate  Insight: Appropriate  Engagement in Group:  Engaged and Supportive  Modes of Intervention:  Activity, Discussion, Socialization and Support   Elijio MilesMercer, Anthonie Lotito N 01/14/2014, 2:20 PM

## 2014-01-14 NOTE — Progress Notes (Signed)
[facility]  01/14/2014 1:34 PM Tedra CoupeLonnie R Pickert  MRN:  [id number]    Subjective:    Patient states he is feeling significantly better today- less anxious 10/10 ("only because I want to go home", less     depressed 0/10  He is focused on going home.   He has been attending groups, he is calm and cooperative.  He speak to his strong support from his wife, mother and church.  At this time not endorsing any medication side effects. Thus far tolerating Effexor XR trial well.  Diagnosis:  Major Depression, severe, no psychotic symptoms, Opiate Dependence, Benzodiazepine Dependence, Opiate withdrawal, Benzodiazepine Withdrawal  Total Time spent with patient: 25 minutes    ADL's:  improving  Sleep: fair  Appetite:improving   Suicidal Ideation:  At this time denies suicidal ideations Homicidal Ideation:  At this time denies homicidal ideations AEB (as evidenced by):  Psychiatric Specialty Exam: Physical Exam  ROS  Blood pressure 147/66, pulse 72, temperature 97.2 F (36.2 C), temperature source Oral, resp. rate 16, height 5\' 10"  (1.778 m), weight 89.926 kg (198 lb 4 oz).Body mass index is 28.45 kg/(m^2).  General Appearance: Fairly Groomed  Patent attorneyye Contact::  Good  Speech:  Normal Rate  Volume:  Normal  Mood:  improved, also presenting with less anxious, fuller range of affect  Affect:  more reactive, less anxious  Thought Process:  Linear  Orientation:  Other:  fully alert and attentive  Thought Content:  no hallucinations, no delusions  Suicidal Thoughts:  No at this time denies any thoughts of hurting self and  contracts for safety on unit   Homicidal Thoughts:  No  Memory: recent and remote grossly intact   Judgement:  Fair  Insight:  Fair  Psychomotor Activity:  normal  Concentration:  Good  Recall:  Good  Fund of Knowledge:Good  Language: Good  Akathisia:  Negative  Handed:  Right  AIMS (if indicated):     Assets:  Desire for  Improvement Housing Resilience  Sleep:  Number of Hours: 0.75   Musculoskeletal: Strength & Muscle Tone: within normal limits - at this time no significant tremors, no diaphoresis, no acute distress or discomfort noted. Gait & Station: normal Patient leans: N/A  Current Medications: Current Facility-Administered Medications  Medication Dose Route Frequency Provider Last Rate Last Dose  . acetaminophen (TYLENOL) tablet 650 mg  650 mg Oral Q6H PRN Fransisca KaufmannLaura Davis, NP   650 mg at 01/13/14 0436  . alum & mag hydroxide-simeth (MAALOX/MYLANTA) 200-200-20 MG/5ML suspension 30 mL  30 mL Oral Q4H PRN Fransisca KaufmannLaura Davis, NP      . cloNIDine (CATAPRES) tablet 0.1 mg  0.1 mg Oral BH-qamhs Craige CottaFernando A Cobos, MD       Followed by  . [START ON 01/17/2014] cloNIDine (CATAPRES) tablet 0.1 mg  0.1 mg Oral QAC breakfast Rockey SituFernando A Cobos, MD      . dicyclomine (BENTYL) tablet 20 mg  20 mg Oral Q6H PRN Rockey SituFernando A Cobos, MD      . feeding supplement (ENSURE COMPLETE) (ENSURE COMPLETE) liquid 237 mL  237 mL Oral BID BM Tilda FrancoLindsey Baker, RD   237 mL at 01/14/14 0845  . gabapentin (NEURONTIN) capsule 400 mg  400 mg Oral TID Craige CottaFernando A Cobos, MD   400 mg at 01/14/14 1219  . hydrOXYzine (ATARAX/VISTARIL) tablet 25 mg  25 mg Oral Q6H PRN Craige CottaFernando A Cobos, MD   25 mg at 01/14/14 0834  . hydrOXYzine (ATARAX/VISTARIL) tablet 50 mg  50 mg Oral QHS,MR X 1 Kerry Hough, PA-C   50 mg at 01/14/14 1610  . loperamide (IMODIUM) capsule 2-4 mg  2-4 mg Oral PRN Craige Cotta, MD      . LORazepam (ATIVAN) tablet 1 mg  1 mg Oral Q6H PRN Craige Cotta, MD   1 mg at 01/14/14 0834  . LORazepam (ATIVAN) tablet 1 mg  1 mg Oral BID Craige Cotta, MD       Followed by  . [START ON 01/16/2014] LORazepam (ATIVAN) tablet 1 mg  1 mg Oral Daily Fernando A Cobos, MD      . magnesium hydroxide (MILK OF MAGNESIA) suspension 30 mL  30 mL Oral Daily PRN Fransisca Kaufmann, NP      . methocarbamol (ROBAXIN) tablet 500 mg  500 mg Oral Q8H PRN Craige Cotta, MD      . multivitamin with minerals tablet 1 tablet  1 tablet Oral Daily Craige Cotta, MD   1 tablet at 01/14/14 0834  . naproxen (NAPROSYN) tablet 500 mg  500 mg Oral BID PRN Craige Cotta, MD   500 mg at 01/12/14 2140  . nicotine polacrilex (NICORETTE) gum 2 mg  2 mg Oral PRN Craige Cotta, MD   2 mg at 01/14/14 1329  . ondansetron (ZOFRAN-ODT) disintegrating tablet 4 mg  4 mg Oral Q6H PRN Craige Cotta, MD      . thiamine (VITAMIN B-1) tablet 100 mg  100 mg Oral Daily Craige Cotta, MD   100 mg at 01/14/14 0834  . venlafaxine XR (EFFEXOR-XR) 24 hr capsule 75 mg  75 mg Oral Q breakfast Craige Cotta, MD   75 mg at 01/14/14 9604    Lab Results:  Results for orders placed or performed during the hospital encounter of 01/11/14 (from the past 48 hour(s))  Hemoglobin A1c     Status: None   Collection Time: 01/13/14  6:11 AM  Result Value Ref Range   Hgb A1c MFr Bld 5.6 <5.7 %    Comment: (NOTE)                                                                       According to the ADA Clinical Practice Recommendations for 2011, when HbA1c is used as a screening test:  >=6.5%   Diagnostic of Diabetes Mellitus           (if abnormal result is confirmed) 5.7-6.4%   Increased risk of developing Diabetes Mellitus References:Diagnosis and Classification of Diabetes Mellitus,Diabetes Care,2011,34(Suppl 1):S62-S69 and Standards of Medical Care in         Diabetes - 2011,Diabetes Care,2011,34 (Suppl 1):S11-S61.    Mean Plasma Glucose 114 <117 mg/dL    Comment: Performed at [facility]    Physical Findings: AIMS:  , ,  ,  ,    CIWA:  CIWA-Ar Total: 7 COWS:  COWS Total Score: 14   Assessment:  Treatment Plan Summary: Daily contact with patient to assess and evaluate symptoms and progress in treatment Medication management See below  Plan: Continue inpatient treatment and support  Continue Clonidine taper as per opiate detox protocol Continue Ativan  taper as per CIWA  Detox protocol Continue  Neurontin 400 mgrs TID Continue Effexor XR 75 mgrs QDAY  We discussed importance of attitude and self responsibility in health and well-being  Medical Decision Making Problem Points:  Established problem, stable/improving (1), Review of last therapy session (1) and Review of psycho-social stressors (1) Data Points:  Review of medication regiment & side effects (2)  I certify that inpatient services furnished can reasonably be expected to improve the patient's condition.   Lorinda CreedLARACH, Abu Heavin  PMHNP 01/14/2014, 1:34 PM

## 2014-01-14 NOTE — Progress Notes (Signed)
Patient ID: Marcus CoupeLonnie R Godar, male   DOB: November 16, 1970, 43 y.o.   MRN: [id number] D)  Has been in the dayroom this evening, watching tv , interacting appropriately with staff and pers.  Attended group, participated , discussed his plans for discharge.  Stated he is hoping to be discharged tomorrow as he is planning to be baptized on Sunday, visited with his pastor today. A)  Will continue to monitor for safety, support, continuePOC R)  Safety maintained.

## 2014-01-14 NOTE — [facility] Group Notes (Signed)
[facility] LCSW Group Therapy  01/14/2014 12:41 PM  Type of Therapy:  Group Therapy  Participation Level:  Minimal  Participation Quality:  Appropriate  Affect:  Appropriate  Cognitive:  Alert  Insight:  Developing/Improving  Engagement in Therapy:  Engaged  Modes of Intervention:  Discussion  Summary of Progress/Problems:Patient participated in group today during which the discussion was about coping strategies. In group we discussed what are negative coping strategies and how we have developed them over the years. Then processed examples of positive coping mechanisms.  The group then processed how to use positive attributes in order to develop their copingmechanisms. Group proceded through discussion and open dialogue.   Beverly SessionsLINDSEY, Akela Pocius J 01/14/2014, 12:41 PM

## 2014-01-14 NOTE — Progress Notes (Signed)
Patient ID: Marcus [name], male   DOB: [birth year], 43 y.o.   MRN: [id number]   D: Pt has been very depressed, irritable, agitated, and anxious on the unit today. Pt reported severe withdrawal symptoms and has required several as needed medications. Pt was in the bed most of the day, and reported that he just needed to sleep it off. Pt reported his depression as a 0, and his helplessness/hopelessness as a 0.  Pt reported being negative SI/HI, no AH/VH noted. A: 15 min checks continued for patient safety. R: Pt safety maintained.

## 2014-01-15 NOTE — Progress Notes (Signed)
Pt attended NA group this evening.  

## 2014-01-15 NOTE — [facility] Group Notes (Signed)
[facility] LCSW Group Therapy  [name] [name]  Type of Therapy:  Group Therapy  Participation Level:  Active  Participation Quality:  Appropriate, Attentive, Sharing and Supportive  Affect:  Appropriate  Cognitive:  Alert, Appropriate and Oriented  Insight:  Improving  Engagement in Therapy:  Engaged and Supportive  Modes of Intervention:  Discussion  Summary of Progress/Problems: Group topic was about developing strong support systems.  This group began by discussing the difference between supports and enablers.  Group was able to clearly define supports as positive and enablers as negative.  With some time the group could further identify that the difference between enablers and supporters is the patient. Group empowered patient to see themselves as the leader of their own team and empowered to create appropriate supports from those who had previously been enablers.   Marcus SessionsLINDSEY, Marcus [name], Marcus [name]

## 2014-01-15 NOTE — [facility] Group Notes (Signed)
[facility] Group Notes:  (Nursing/MHT/Case Management/Adjunct)  Date:  01/15/2014  Time:  2:51 PM  Type of Therapy:  Nurse Education  Participation Level:  Minimal  Participation Quality:  Appropriate  Affect:  Anxious and Appropriate  Cognitive:  Alert  Insight:  Limited  Engagement in Group:  Engaged  Modes of Intervention:  Discussion and Education  Summary of Progress/Problems: The purpose of this group is to discuss rules and regulations while on the unit. Patients discuss the treatment agreement and group is open for questions or concerns. Patient was attentive in group but did not voice and questions or concerns.  Marzetta BoardDopson, Rodrigues Urbanek E 01/15/2014, 2:51 PM

## 2014-01-15 NOTE — Plan of Care (Signed)
Problem: Diagnosis: Increased Risk For Suicide Attempt Goal: STG-Patient Will Report Suicidal Feelings to Staff Outcome: Progressing Pt currently denies any suicidal ideation and does contract verbally to come to staff before acting on any self-harm thoughts.

## 2014-01-15 NOTE — Progress Notes (Signed)
[facility] MD Progress Note  01/15/2014 1:57 PM Marcus [name]  MRN:  [id number]    Subjective:    Patient states he is feeling significantly better today- less anxious 8/10  less     depressed 0/10   He is focused on going home.  He has been attending groups, he is calm and cooperative.   Visited by wife and pastor last night, planned Baptism for himself  on Wednesday  He speak to his strong support from his wife, mother and church.  At this time not endorsing any medication side effects. Thus far tolerating Effexor XR trial well.  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 He appears well-developed and well-nourished.  HENT:  Head: Normocephalic and atraumatic.  Neck: Normal range of motion. Neck supple.  Musculoskeletal: Normal range of motion.  Neurological: He is alert and oriented to person, place, and time.  Skin: Skin is warm and dry.    ROS  Blood pressure 147/79, pulse 97, temperature 98 F (36.7 C), temperature source Oral, resp. rate 20, height 5\' 10"  (1.778 m), weight 89.926 kg (198 lb 4 oz).Body mass index is 28.45 kg/(m^2).  General Appearance: Fairly Groomed  Patent attorneyye Contact::  Good  Speech:  Normal Rate  Volume:  Normal  Mood:  improved, also presenting with less anxious, fuller range of affect  Affect:  more reactive, less anxious  Thought Process:  Linear  Orientation:  Other:  fully alert and attentive  Thought Content:  no hallucinations, no delusions  Suicidal Thoughts:  No at this time denies any  thoughts of hurting self and  contracts for safety on unit   Homicidal Thoughts:  No  Memory: recent and remote grossly intact   Judgement:  Fair  Insight:  Fair  Psychomotor Activity:  normal  Concentration:  Good  Recall:  Good  Fund of Knowledge:Good  Language: Good  Akathisia:  Negative  Handed:  Right  AIMS (if indicated):     Assets:  Desire for Improvement Housing Resilience  Sleep:  Number of Hours: 5.25   Musculoskeletal: Strength & Muscle Tone: within normal limits - at this time no significant tremors, no diaphoresis, no acute distress or discomfort noted. Gait & Station: normal Patient leans: N/A  Current Medications: Current Facility-Administered Medications  Medication Dose Route Frequency Provider Last Rate Last Dose  . acetaminophen (TYLENOL) tablet 650 mg  650 mg Oral Q6H PRN Fransisca KaufmannLaura Davis, NP   650 mg at 01/15/14 [id number]  . alum & mag hydroxide-simeth (MAALOX/MYLANTA) 200-200-20 MG/5ML suspension 30 mL  30 mL Oral Q4H PRN Fransisca KaufmannLaura Davis, NP      . cloNIDine (CATAPRES) tablet 0.1 mg  0.1 mg Oral BH-qamhs Rockey SituFernando A Cobos, MD   0.1 mg at 01/15/14 0747   Followed by  . [START ON 01/17/2014] cloNIDine (CATAPRES) tablet 0.1 mg  0.1 mg Oral QAC breakfast Rockey SituFernando A Cobos, MD      . dicyclomine (BENTYL) tablet 20 mg  20 mg Oral Q6H PRN Rockey SituFernando A Cobos, MD      . feeding supplement (ENSURE COMPLETE) (ENSURE COMPLETE) liquid 237 mL  237  mL Oral BID BM Tilda FrancoLindsey Baker, RD   237 mL at 01/15/14 1021  . gabapentin (NEURONTIN) capsule 400 mg  400 mg Oral TID Craige CottaFernando A Cobos, MD   400 mg at 01/15/14 1152  . hydrOXYzine (ATARAX/VISTARIL) tablet 25 mg  25 mg Oral Q6H PRN Craige CottaFernando A Cobos, MD   25 mg at 01/14/14 0834  . hydrOXYzine (ATARAX/VISTARIL) tablet 50 mg  50 mg Oral QHS,MR X 1 Kerry HoughSpencer E Simon, PA-C   50 mg at 01/14/14 2138  . loperamide (IMODIUM) capsule 2-4 mg  2-4 mg Oral PRN Craige CottaFernando A Cobos, MD      . Melene Muller[START ON 01/16/2014] LORazepam (ATIVAN) tablet 1 mg  1 mg Oral Daily  Fernando A Cobos, MD      . magnesium hydroxide (MILK OF MAGNESIA) suspension 30 mL  30 mL Oral Daily PRN Fransisca KaufmannLaura Davis, NP      . methocarbamol (ROBAXIN) tablet 500 mg  500 mg Oral Q8H PRN Craige CottaFernando A Cobos, MD      . multivitamin with minerals tablet 1 tablet  1 tablet Oral Daily Craige CottaFernando A Cobos, MD   1 tablet at 01/15/14 0747  . naproxen (NAPROSYN) tablet 500 mg  500 mg Oral BID PRN Craige CottaFernando A Cobos, MD   500 mg at 01/12/14 2140  . nicotine polacrilex (NICORETTE) gum 2 mg  2 mg Oral PRN Craige CottaFernando A Cobos, MD   2 mg at 01/15/14 0820  . ondansetron (ZOFRAN-ODT) disintegrating tablet 4 mg  4 mg Oral Q6H PRN Craige CottaFernando A Cobos, MD      . thiamine (VITAMIN B-1) tablet 100 mg  100 mg Oral Daily Craige CottaFernando A Cobos, MD   100 mg at 01/15/14 0747  . venlafaxine XR (EFFEXOR-XR) 24 hr capsule 75 mg  75 mg Oral Q breakfast Craige CottaFernando A Cobos, MD   75 mg at 01/15/14 0747    Lab Results:  No results found for this or any previous visit (from the past 48 hour(s)).  Physical Findings: AIMS:  , ,  ,  ,    CIWA:  CIWA-Ar Total: 6 COWS:  COWS Total Score: 6   Assessment:  Treatment Plan Summary: Daily contact with patient to assess and evaluate symptoms and progress in treatment Medication management See below  Plan: Continue inpatient treatment and support  Continue Clonidine taper as per opiate detox protocol Continue Ativan taper as per CIWA  Detox protocol Continue Neurontin 400 mgrs TID Continue Effexor XR 75 mgrs QDAY  We discussed importance of attitude and self responsibility in health and well-being  Medical Decision Making Problem Points:  Established problem, stable/improving (1), Review of last therapy session (1) and Review of psycho-social stressors (1) Data Points:  Review of medication regiment & side effects (2)  I certify that inpatient services furnished can reasonably be expected to improve the patient's condition.   Marcus CreedLARACH, Yaniel Limbaugh  PMHNP 01/15/2014, 1:57 PM

## 2014-01-15 NOTE — Progress Notes (Signed)
Patient ID: Marcus CoupeLonnie R Muraski, male   DOB: Jan 24, 1971, 43 y.o.   MRN: [id number] D)  Spent most of the evening in the dayroom, attended group, interacting appropriately with staff and select peers.  Stated had hoped to be discharged today, but realized he wasn't ready.  Stated is very thankful to have found the group that will be helping him when he leaves, found the organization through his pastor, [place] [facility] [place]  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  States has given him hope. A)  Will continue to monitor for safety, continue POC R)  Safety maintained,  appreciative.

## 2014-01-15 NOTE — Progress Notes (Signed)
Pt has been up for most of the groups today.  He rated his depression 1 hopelessness 0 and anxiety 6-7 on his self-inventory. Pt denied any S/H ideation or A/V/H. He is hoping to get into a church based program from here.

## 2014-01-15 NOTE — [facility] Group Notes (Signed)
[facility] Group Notes:  (Nursing/MHT/Case Management/Adjunct)  Date:  01/15/2014  Time:  3:43 PM  Type of Therapy:  Nurse Education  Participation Level:  Minimal  Participation Quality:  Appropriate  Affect:  Flat  Cognitive:  Appropriate  Insight:  Limited  Engagement in Group:  Engaged  Modes of Intervention:  Discussion and Education  Summary of Progress/Problems: The purpose of this group is to follow up on earlier expressed concerns and rules. Patient was attentive in group.  Marcus [name], Marcus [name] 01/15/2014, 3:43 PM

## 2014-01-16 DIAGNOSIS — F332 Major depressive disorder, recurrent severe without psychotic features: Secondary | ICD-10-CM | POA: Insufficient documentation

## 2014-01-16 DIAGNOSIS — F1120 Opioid dependence, uncomplicated: Secondary | ICD-10-CM

## 2014-01-16 DIAGNOSIS — F1124 Opioid dependence with opioid-induced mood disorder: Secondary | ICD-10-CM | POA: Insufficient documentation

## 2014-01-16 MED ORDER — GABAPENTIN 400 MG PO CAPS: 400.0000 mg | ORAL_CAPSULE | Freq: Three times a day (TID) | ORAL | Status: DC

## 2014-01-16 MED ORDER — VENLAFAXINE HCL ER 75 MG PO CP24: 75.0000 mg | ORAL_CAPSULE | Freq: Every day | ORAL | Status: DC

## 2014-01-16 MED ORDER — METHOCARBAMOL 500 MG PO TABS: 500.0000 mg | ORAL_TABLET | Freq: Three times a day (TID) | ORAL | Status: DC | PRN

## 2014-01-16 MED ORDER — HYDROXYZINE HCL 25 MG PO TABS: 25.0000 mg | ORAL_TABLET | Freq: Four times a day (QID) | ORAL | Status: DC | PRN

## 2014-01-16 NOTE — Progress Notes (Signed)
D: Pt presents appropriate in affect and pleasant in mood. Pt reports to be feeling great today. He is looking forward to going home and attending his church based program. Pt actively participates within the milieu.  A: Writer administered scheduled medications to pt, per MD orders. Continued support and availability as needed was extended to this pt. Staff continue to monitor pt with q5015min checks.  R: No adverse drug reactions noted. Pt receptive to treatment. Pt remains safe at this time.

## 2014-01-16 NOTE — Plan of Care (Signed)
Problem: Ineffective individual coping Goal: STG: Patient will participate in after care plan Patient will attend attend groups and engage in discussion. Outpatient follow up appointment will be scheduled.  Burnis Medin Sebastion Jun, LCSW 01/12/2014 3:26 PM  Patient has attended groups and easily engaged in discussions. Outpatient follow up is scheduled with [facility].  Micheale Schlack, LCSW 01/16/2014 11:16 AM      Outcome: Completed/Met Date Met:  01/16/14

## 2014-01-16 NOTE — Progress Notes (Signed)
D:  Patient's self inventory sheet, patient sleeps good, no sleep medication given.  Good appetite, high energy level, good concentration.  Denied depression and hopeless.  Rated anxiety #10.  Denied withdrawals.  Denied physical problems.  Goal is to go home today and get some things taken care of.  Plans to discharge and talk on phone.  Does have discharge plans.  No problems anticipated after discharge. A:  Medications administered per MD orders.  Emotional support and encouragement given patient. R:  Denied SI and HI.  Denied A/V hallucinations.  Safety maintained with 15 minute checks.

## 2014-01-16 NOTE — Progress Notes (Signed)
[facility] Discharge Plan :  Will you be returning to the same living situation after discharge: Yes,  Patient is returning to his home with family. At discharge, do you have transportation home?:Yes,  Patient to arrange transportation home. Do you have the ability to pay for your medications:Yes,  Patient has Medicaid.  Release of information consent forms completed and in the chart;  Patient's signature needed at discharge.  Patient to Follow up at: Follow-up Information    [facility] [place] On [facility]18/2014.   Why:  Tuesday, January 17, 2014 at 2:30 PM   Contact information:   [address] [place], [place]   [address]  [phone]      Patient denies SI/HI: Patient no longer endorsing SI/HI or other thoughts of self harm.  Safety Planning and Suicide Prevention discussed: .Reviewed with all patients during discharge planning group  Sricharan Lacomb, Joesph JulyQuylle Hairston 01/16/2014, 11:14 AM

## 2014-01-16 NOTE — Progress Notes (Signed)
Discharge Note:  Patient discharged home.  Denied SI and HI.  Denied A/V hallucinations.  Patient stated he received all his belongings, clothing, toiletries, misc items, prescriptions, medications, hat, socks, shoes.  Suicide prevention information given and discussed with patient who stated he understood and had no questions.  Patient stated he appreciated all assistance [facility].
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
## 2014-01-16 NOTE — Plan of Care (Signed)
Problem: Alteration in mood Goal: LTG-Pt's behavior demonstrates decreased signs of depression Goal not met. Patient is rating depression at ten. Goal is for patient to rate depression at four or below prior to discharge.  Burnis Medin Briaunna Grindstaff, LCSW 01/12/2014  Goal not met. Patient is rating depression at five. Goal is for patient to rate depression at four or below prior to discharge.  Burnis Medin Tytan Sandate, LCSW 01/13/2014 11:42 AM  Goal met. Patient is rating depression at one. Goal is for depression to be rated at three or below on discharge.  Burnis Medin Michaelpaul Apo, LCSW 11:17 AM         (Patient's behavior demonstrates decreased signs of depression to the point the patient is safe to return home and continue treatment in an outpatient setting)  Outcome: Completed/Met Date Met:  01/16/14

## 2014-01-16 NOTE — [facility] Suicide Risk Assessment (Addendum)
Demographic Factors:  43 year old male, unemployed, lives at home with wife and children  Total Time spent with patient: 30 minutes  Psychiatric Specialty Exam: Physical Exam  ROS  Blood pressure 131/75, pulse 80, temperature 98 F (36.7 C), temperature source Oral, resp. rate 16, height 5\' 10"  (1.778 m), weight 89.926 kg (198 lb 4 oz).Body mass index is 28.45 kg/(m^2).  General Appearance: improved grooming  Eye Contact::  Good  Speech:  Normal Rate  Volume:  Normal  Mood:  reported by patient as improved- affect improved, does remains vaguely anxious  Affect:  Appropriate and slightly anxious  Thought Process:  Goal Directed and Linear  Orientation:  Full (Time, Place, and Person)  Thought Content:  denies hallucinations, no delusions  Suicidal Thoughts:  No at this time denies any thoughts of hurting self and  contracts for safety on unit   Homicidal Thoughts:  No  Memory:  Recent and Remote grossly intact  Judgement:  Other:  improved  Insight:  Fair  Psychomotor Activity:  Normal  Concentration:  Good  Recall:  Good  Fund of Knowledge:Good  Language: Good  Akathisia:  No  Handed:  Right  AIMS (if indicated):     Assets:  Desire for Improvement Physical Health Resilience Social Support  Sleep:  Number of Hours: 5.25    Musculoskeletal: Strength & Muscle Tone: within normal limits Gait & Station: normal Patient leans: N/A   Mental Status Per Nursing Assessment::   On Admission:     Current Mental Status by Physician: At time of discharge, patient is significantly improved compared to admission- he is describing improved mood, and although still vaguely anxious,states the is feeling well at this time. At present denies any symptoms of withdrawal and presents calm and in no acute distress. He is not restless or agitated. He is denying psychotic symptoms and is currently futue oriented. Vitals are stable.  Loss Factors: unemployment  Historical  Factors: Opiate Dependence, BZD Abuse, No history of prior suicide attempts or self injurious behaviors.  Risk Reduction Factors:   Responsible for children under 43 years of age, Sense of responsibility to family, Living with another person, especially a relative and Positive coping skills or problem solving skills  Continued Clinical Symptoms:  As noted, at this time patient significantly improved compared to admission. Still vaguely anxious but mood is euthymic and he is future oriented, looking forward to reuniting with his loved ones. Behavior on unit in good control. He is not currently presenting with ongoing withdrawal symptoms and appears calm, in no acute distress, and vitals are stable.  Cognitive Features That Contribute To Risk:  No gross cognitive deficits noted upon discharge. Is alert , attentive, and oriented x 3   Suicide Risk:  Mild:  Suicidal ideation of limited frequency, intensity, duration, and specificity.  There are no identifiable plans, no associated intent, mild dysphoria and related symptoms, good self-control (both objective and subjective assessment), few other risk factors, and identifiable protective factors, including available and accessible social support.  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 Chronic back pain    AXIS IV:  economic problems, occupational problems and problems related to social environment AXIS V:  51-60 moderate symptoms  Plan Of Care/Follow-up recommendations:  Activity:  As tolerated Diet:  Regular Tests:  NA Other:  See below  Is patient on multiple antipsychotic therapies at discharge:  No   Has Patient had three or more failed trials of antipsychotic monotherapy by history:  No  Recommended Plan for  Multiple Antipsychotic Therapies: NA  Patient is leaving unit in good spirits. He plans to return home to family. Follow up as below- Patient to Follow up at: Follow-up Information    [facility] [place] [facility]/18/2014.   Why: Tuesday, January 17, 2014 at 2:30 PM   Contact information:   [address] [place], [place] [address]  Patient to follow up with PCP regarding ongoing medical issues as needed. Encouraged to abstain from drugs, and to maintain focus on sobriety/abstinence efforts.    Marcus [name] 01/16/2014, 11:17 AM

## 2014-01-16 NOTE — [facility] Group Notes (Signed)
[facility] Note   01/16/2014 11:13 AM    Participation Quality:  Appropraite  Mood/Affect:  Appropriate  Depression Rating:  1  Anxiety Rating:  7 (situational)  Thoughts of Suicide:  No  Will you contract for safety?   NA  Current AVH:  No  Plan for Discharge/Comments:  Patient attended discharge planning group and actively participated in group. Patient reports doing well and looking forward to discharge today.  [facility]  Patient is returning home with family and states he has support from his church.  Suicide prevention education reviewed and SPE document provided.   Transportation Means: Patient has transportation.   Supports:  Patient has a support system.   Rendon Howell, Joesph JulyQuylle Hairston

## 2014-01-18 NOTE — Progress Notes (Signed)
Patient Discharge Instructions:  After Visit Summary (AVS):   Faxed to:  01/18/14 Discharge Summary Note:   Faxed to:  01/18/14 Psychiatric Admission Assessment Note:   Faxed to:  01/18/14 Suicide Risk Assessment - Discharge Assessment:   Faxed to:  01/18/14 Faxed/Sent to the Next Level Care provider:  01/18/14  [facility] @ [phone]  Jerelene ReddenSheena E Wyatt, 01/18/2014, 1:49 PM

## 2014-02-27 ENCOUNTER — Telehealth (HOSPITAL_COMMUNITY): Payer: Self-pay

## 2014-02-27 NOTE — Telephone Encounter (Signed)
02/27/14 3:29pm Patient's wife called stating that the patient is incarcerated and need medication - pt's wife requesting to make an appt - informed of HIPPA laws that the patient will have to call./sh

## 2014-03-21 ENCOUNTER — Emergency Department (EMERGENCY_DEPARTMENT_HOSPITAL)
Admission: EM | Admit: 2014-03-21 | Discharge: 2014-03-23 | Disposition: A | Discharge status: Other Acute Inpatient Hospital | Payer: Medicaid Other | Source: Home / Self Care | Attending: Emergency Medicine | Admitting: Emergency Medicine

## 2014-03-21 ENCOUNTER — Encounter (HOSPITAL_COMMUNITY): Payer: Self-pay

## 2014-03-21 DIAGNOSIS — F329 Major depressive disorder, single episode, unspecified: Secondary | ICD-10-CM

## 2014-03-21 DIAGNOSIS — F332 Major depressive disorder, recurrent severe without psychotic features: Secondary | ICD-10-CM | POA: Diagnosis present

## 2014-03-21 DIAGNOSIS — F10920 Alcohol use, unspecified with intoxication, uncomplicated: Secondary | ICD-10-CM

## 2014-03-21 DIAGNOSIS — T50902A Poisoning by unspecified drugs, medicaments and biological substances, intentional self-harm, initial encounter: Secondary | ICD-10-CM

## 2014-03-21 DIAGNOSIS — F32A Depression, unspecified: Secondary | ICD-10-CM

## 2014-03-21 DIAGNOSIS — F1910 Other psychoactive substance abuse, uncomplicated: Secondary | ICD-10-CM

## 2014-03-21 HISTORY — DX: Poisoning by unspecified drugs, medicaments and biological substances, intentional self-harm, initial encounter: T50.902A

## 2014-03-21 LAB — RAPID URINE DRUG SCREEN, HOSP PERFORMED
AMPHETAMINES: NOT DETECTED
BENZODIAZEPINES: POSITIVE — AB
Barbiturates: NOT DETECTED
Cocaine: NOT DETECTED
OPIATES: NOT DETECTED
Tetrahydrocannabinol: NOT DETECTED

## 2014-03-21 LAB — COMPREHENSIVE METABOLIC PANEL
ALBUMIN: 4 g/dL (ref 3.5–5.2)
ALT: 209 U/L — ABNORMAL HIGH (ref 0–53)
ANION GAP: 8 (ref 5–15)
AST: 105 U/L — ABNORMAL HIGH (ref 0–37)
Alkaline Phosphatase: 69 U/L (ref 39–117)
BUN: 12 mg/dL (ref 6–23)
CO2: 26 mmol/L (ref 19–32)
Calcium: 8.6 mg/dL (ref 8.4–10.5)
Chloride: 103 mmol/L (ref 96–112)
Creatinine, Ser: 0.78 mg/dL (ref 0.50–1.35)
GFR calc Af Amer: >90 mL/min (ref >90)
GFR calc non Af Amer: >90 mL/min (ref >90)
GLUCOSE: 90 mg/dL (ref 70–99)
Potassium: 3.2 mmol/L — ABNORMAL LOW (ref 3.5–5.1)
SODIUM: 137 mmol/L (ref 135–145)
TOTAL PROTEIN: 7.1 g/dL (ref 6.0–8.3)
Total Bilirubin: 0.6 mg/dL (ref 0.3–1.2)

## 2014-03-21 LAB — CBC
HCT: 45.5 % (ref 39.0–52.0)
Hemoglobin: 15.7 g/dL (ref 13.0–17.0)
MCH: 30 pg (ref 26.0–34.0)
MCHC: 34.5 g/dL (ref 30.0–36.0)
MCV: 86.8 fL (ref 78.0–100.0)
PLATELETS: 268 10*3/uL (ref 150–400)
RBC: 5.24 MIL/uL (ref 4.22–5.81)
RDW: 13.4 % (ref 11.5–15.5)
WBC: 8.4 10*3/uL (ref 4.0–10.5)

## 2014-03-21 LAB — SALICYLATE LEVEL

## 2014-03-21 LAB — ETHANOL: Alcohol, Ethyl (B): 122 mg/dL — ABNORMAL HIGH (ref 0–9)

## 2014-03-21 LAB — ACETAMINOPHEN LEVEL

## 2014-03-21 MED ORDER — HYDROXYZINE HCL 25 MG PO TABS
50.0000 mg | ORAL_TABLET | Freq: Three times a day (TID) | ORAL | Status: DC | PRN
  Administered 2014-03-22 – 2014-03-23 (×4): 50 mg via ORAL
  Filled 2014-03-21 (×5): qty 2

## 2014-03-21 MED ORDER — POTASSIUM CHLORIDE CRYS ER 20 MEQ PO TBCR
40.0000 meq | EXTENDED_RELEASE_TABLET | Freq: Once | ORAL | Status: AC
  Administered 2014-03-21: 40 meq via ORAL
  Filled 2014-03-21: qty 2

## 2014-03-21 MED ORDER — SODIUM CHLORIDE 0.9 % IV SOLN
INTRAVENOUS | Status: DC
  Administered 2014-03-21: 14:00:00 via INTRAVENOUS

## 2014-03-21 NOTE — ED Notes (Signed)
One pt belonging bag wanded by security. Pts knife taken and secured by security.

## 2014-03-21 NOTE — ED Provider Notes (Signed)
CSN: [id number]     Arrival date & time 03/21/14  1315 History   First MD Initiated Contact with Patient 03/21/14 1323     Chief Complaint  Patient presents with  . Drug Overdose  . Suicidal     (Consider location/radiation/quality/duration/timing/severity/associated sxs/prior Treatment) Patient is a 44 y.o. male presenting with Overdose. The history is provided by the patient. The history is limited by the condition of the patient.  Drug Overdose  pt w hx substance abuse, w ?etoh and substance abuse and states took 5-10 valium pills approximately 3 hours ago. Denies other ingestion. +depressed. Doesn't answer directly on question suicidal thoughts. Pt v poor historian, poorly responsive to many questions - level 5 caveat.       Past Medical History  Diagnosis Date  . Chronic back pain    Past Surgical History  Procedure Laterality Date  . None     History reviewed. No pertinent family history. History  Substance Use Topics  . Smoking status: Never Smoker   . Smokeless tobacco: Current User    Types: Chew  . Alcohol Use: No     Comment: denies 11/16/13        Review of Systems  Unable to perform ROS: Psychiatric disorder  level 5 caveat - pt uncooperative w ros/psych disorder.    Allergies  Benadryl; Seroquel; and Trazodone and nefazodone  Home Medications   Prior to Admission medications   Medication Sig Start Date End Date Taking? Authorizing Provider  gabapentin (NEURONTIN) 400 MG capsule Take 1 capsule (400 mg total) by mouth 3 (three) times daily. 01/16/14   Lindwood Qua, NP  hydrOXYzine (ATARAX/VISTARIL) 25 MG tablet Take 1 tablet (25 mg total) by mouth every 6 (six) hours as needed for anxiety. 01/16/14   Velna Hatchet May Agustin, NP  methocarbamol (ROBAXIN) 500 MG tablet Take 1 tablet (500 mg total) by mouth every 8 (eight) hours as needed for muscle spasms. 01/16/14   Velna Hatchet May Agustin, NP  venlafaxine XR (EFFEXOR-XR) 75 MG 24 hr capsule Take 1  capsule (75 mg total) by mouth daily with breakfast. 01/16/14   Velna Hatchet May Agustin, NP   BP 123/83 mmHg  Pulse 100  Temp(Src) 97.7 F (36.5 C) (Oral)  Resp 16  SpO2 94% Physical Exam  Constitutional: He is oriented to person, place, and time. He appears well-developed and well-nourished. No distress.  HENT:  Head: Atraumatic.  Mouth/Throat: Oropharynx is clear and moist.  Eyes: Conjunctivae are normal. Pupils are equal, round, and reactive to light. No scleral icterus.  Neck: Neck supple. No tracheal deviation present.  Cardiovascular: Normal rate, regular rhythm, normal heart sounds and intact distal pulses.   Pulmonary/Chest: Effort normal and breath sounds normal. No accessory muscle usage. No respiratory distress.  Abdominal: Soft. Bowel sounds are normal. He exhibits no distension. There is no tenderness.  Musculoskeletal: Normal range of motion. He exhibits no edema or tenderness.  Neurological: He is alert and oriented to person, place, and time.  Moves bil ext purposefully.   Skin: Skin is warm and dry. He is not diaphoretic.  Psychiatric:  Depressed mood/flat affect  Nursing note and vitals reviewed.   ED Course  Procedures (including critical care time) Labs Review  Results for orders placed or performed during the hospital encounter of 03/21/14  Comprehensive metabolic panel  Result Value Ref Range   Sodium 137 135 - 145 mmol/L   Potassium 3.2 (L) 3.5 - 5.1 mmol/L   Chloride 103 96 - 112 mmol/L  CO2 26 19 - 32 mmol/L   Glucose, Bld 90 70 - 99 mg/dL   BUN 12 6 - 23 mg/dL   Creatinine, Ser 1.610.78 0.50 - 1.35 mg/dL   Calcium 8.6 8.4 - 09.610.5 mg/dL   Total Protein 7.1 6.0 - 8.3 g/dL   Albumin 4.0 3.5 - 5.2 g/dL   AST 045105 (H) 0 - 37 U/L   ALT 209 (H) 0 - 53 U/L   Alkaline Phosphatase 69 39 - 117 U/L   Total Bilirubin 0.6 0.3 - 1.2 mg/dL   GFR calc non Af Amer >90 >90 mL/min   GFR calc Af Amer >90 >90 mL/min   Anion gap 8 5 - 15  Ethanol  Result Value Ref Range    Alcohol, Ethyl (B) 122 (H) 0 - 9 mg/dL  CBC  Result Value Ref Range   WBC 8.4 4.0 - 10.5 K/uL   RBC 5.24 4.22 - 5.81 MIL/uL   Hemoglobin 15.7 13.0 - 17.0 g/dL   HCT 40.945.5 81.139.0 - 91.452.0 %   MCV 86.8 78.0 - 100.0 fL   MCH 30.0 26.0 - 34.0 pg   MCHC 34.5 30.0 - 36.0 g/dL   RDW 78.213.4 95.611.5 - 21.315.5 %   Platelets 268 150 - 400 K/uL  Acetaminophen level  Result Value Ref Range   Acetaminophen (Tylenol), Serum <10.0 (L) 10 - 30 ug/mL  Salicylate level  Result Value Ref Range   Salicylate Lvl <4.0 2.8 - 20.0 mg/dL  Urine rapid drug screen (hosp performed)  Result Value Ref Range   Opiates NONE DETECTED NONE DETECTED   Cocaine NONE DETECTED NONE DETECTED   Benzodiazepines POSITIVE (A) NONE DETECTED   Amphetamines NONE DETECTED NONE DETECTED   Tetrahydrocannabinol NONE DETECTED NONE DETECTED   Barbiturates NONE DETECTED NONE DETECTED       EKG Interpretation   Date/Time:  Tuesday March 21 2014 13:18:33 EST Ventricular Rate:  102 PR Interval:  157 QRS Duration: 94 QT Interval:  345 QTC Calculation: 449 R Axis:   73 Text Interpretation:  Sinus tachycardia No significant change since last  tracing Confirmed by Denton LankSTEINL  MD, Caryn BeeKEVIN ([id number]) on 03/21/2014 1:34:04 PM      MDM   Labs.  Reviewed nursing notes and prior charts for additional history.   Recheck pt awake and alert, no resp depression. No pain. No nv.  Psych team consulted. Disposition per psych team.     Suzi RootsKevin E Aylana Hirschfeld, MD 03/23/14 [phone]

## 2014-03-21 NOTE — Progress Notes (Signed)
  CARE MANAGEMENT ED NOTE 03/21/2014  Patient:  Marcus [name],Marcus [name]   Account Number:  [id number]  Date Initiated:  03/21/2014  Documentation initiated by:  Radford PaxFERRERO,Kellee Sittner  Subjective/Objective Assessment:   Patient presents to Ed with overdose, SI     Subjective/Objective Assessment Detail:   Patient brought to ED from home by his mother. Patient with feelings of hopelessness and feeling worthless.  Patient reports not having workedin eight years.  Ex wife is a heroine addictand has patient's son in [place] per patient.     Action/Plan:   Inpatient treatment recommended   Action/Plan Detail:   Anticipated DC Date:       Status Recommendation to Physician:   Result of Recommendation:    Other ED Services  Consult Working Plan    DC Planning Services  Other  PCP issues    Choice offered to / List presented to:            Status of service:  Completed, signed off  ED Comments:   ED Comments Detail:  Patient asleep at this time.  EDCM did not wake the patient at this time.  Patient listed as having Medicaid [place] access insurance with Dr. Watt ClimesEscajeda listed as pcp.

## 2014-03-21 NOTE — ED Notes (Signed)
Per pt, brought in from home by Mom.  Pt was found lethargic and stating he took meds.  Pt wife at work.  Pt verbalizes xanax and valium. Unknown amount.  Pt very lethargic on assessment.  States previous hx of same.
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
## 2014-03-21 NOTE — ED Notes (Signed)
Pt. Is unable to use the restroom at this, but is aware that we need a urine specimen. Urinal at the bedside.

## 2014-03-21 NOTE — ED Notes (Addendum)
Mother took pt wallet home with her.  Other clothing kept in hospital.  Pt has knife locked with security.

## 2014-03-21 NOTE — ED Notes (Signed)
Bed: OB09WA25 Expected date:  Expected time:  Means of arrival:  Comments: overdose
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
## 2014-03-21 NOTE — BH Assessment (Signed)
Inpt recommended. No appropriate beds available, sent referrals to: [facility], [place] Triage Specialist 03/21/2014 8:21 PM

## 2014-03-21 NOTE — ED Notes (Signed)
Patient has a knife locked up with security.

## 2014-03-22 DIAGNOSIS — F10929 Alcohol use, unspecified with intoxication, unspecified: Secondary | ICD-10-CM | POA: Insufficient documentation

## 2014-03-22 DIAGNOSIS — T50902A Poisoning by unspecified drugs, medicaments and biological substances, intentional self-harm, initial encounter: Secondary | ICD-10-CM

## 2014-03-22 DIAGNOSIS — F332 Major depressive disorder, recurrent severe without psychotic features: Secondary | ICD-10-CM

## 2014-03-22 DIAGNOSIS — T50901A Poisoning by unspecified drugs, medicaments and biological substances, accidental (unintentional), initial encounter: Secondary | ICD-10-CM | POA: Insufficient documentation

## 2014-03-22 MED ORDER — ZIPRASIDONE MESYLATE 20 MG IM SOLR
20.0000 mg | Freq: Once | INTRAMUSCULAR | Status: AC
  Administered 2014-03-22: 20 mg via INTRAMUSCULAR
  Filled 2014-03-22: qty 20

## 2014-03-22 MED ORDER — VENLAFAXINE HCL ER 37.5 MG PO CP24
37.5000 mg | ORAL_CAPSULE | Freq: Every day | ORAL | Status: DC
  Administered 2014-03-22 – 2014-03-23 (×2): 37.5 mg via ORAL
  Filled 2014-03-22 (×3): qty 1

## 2014-03-22 MED ORDER — GABAPENTIN 100 MG PO CAPS
100.0000 mg | ORAL_CAPSULE | Freq: Three times a day (TID) | ORAL | Status: DC
  Administered 2014-03-22 – 2014-03-23 (×4): 100 mg via ORAL
  Filled 2014-03-22 (×4): qty 1

## 2014-03-22 MED ORDER — LORAZEPAM 2 MG/ML IJ SOLN
2.0000 mg | Freq: Once | INTRAMUSCULAR | Status: AC
  Administered 2014-03-22: 2 mg via INTRAMUSCULAR
  Filled 2014-03-22: qty 1

## 2014-03-22 MED ORDER — NICOTINE 21 MG/24HR TD PT24
21.0000 mg | MEDICATED_PATCH | Freq: Every day | TRANSDERMAL | Status: DC
  Administered 2014-03-22 – 2014-03-23 (×2): 21 mg via TRANSDERMAL
  Filled 2014-03-22 (×2): qty 1

## 2014-03-22 NOTE — ED Notes (Signed)
Pt resting at present, awake, alert & responsive, no distress noted.  Will continue to monitor for safety.
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
## 2014-03-23 ENCOUNTER — Inpatient Hospital Stay (HOSPITAL_COMMUNITY)
Admission: AD | Admit: 2014-03-23 | Discharge: 2014-03-24 | DRG: 885 | Disposition: A | Discharge status: Home / Self Care | Payer: Medicaid Other | Source: Intra-hospital | Attending: Psychiatry | Admitting: Psychiatry

## 2014-03-23 ENCOUNTER — Encounter (HOSPITAL_COMMUNITY): Payer: Self-pay | Admitting: *Deleted

## 2014-03-23 DIAGNOSIS — G4700 Insomnia, unspecified: Secondary | ICD-10-CM | POA: Diagnosis present

## 2014-03-23 DIAGNOSIS — M549 Dorsalgia, unspecified: Secondary | ICD-10-CM | POA: Diagnosis present

## 2014-03-23 DIAGNOSIS — F410 Panic disorder [episodic paroxysmal anxiety] without agoraphobia: Secondary | ICD-10-CM | POA: Diagnosis present

## 2014-03-23 DIAGNOSIS — F1320 Sedative, hypnotic or anxiolytic dependence, uncomplicated: Secondary | ICD-10-CM | POA: Diagnosis present

## 2014-03-23 DIAGNOSIS — F332 Major depressive disorder, recurrent severe without psychotic features: Secondary | ICD-10-CM | POA: Diagnosis not present

## 2014-03-23 DIAGNOSIS — F329 Major depressive disorder, single episode, unspecified: Secondary | ICD-10-CM | POA: Diagnosis present

## 2014-03-23 DIAGNOSIS — G8929 Other chronic pain: Secondary | ICD-10-CM | POA: Diagnosis present

## 2014-03-23 DIAGNOSIS — F10929 Alcohol use, unspecified with intoxication, unspecified: Secondary | ICD-10-CM | POA: Diagnosis present

## 2014-03-23 MED ORDER — ALUM & MAG HYDROXIDE-SIMETH 200-200-20 MG/5ML PO SUSP: 30.0000 mL | ORAL | Status: DC | PRN

## 2014-03-23 MED ORDER — GABAPENTIN 100 MG PO CAPS
200.0000 mg | ORAL_CAPSULE | Freq: Three times a day (TID) | ORAL | Status: DC
  Administered 2014-03-23 – 2014-03-24 (×2): 200 mg via ORAL
  Filled 2014-03-23 (×5): qty 2

## 2014-03-23 MED ORDER — MAGNESIUM HYDROXIDE 400 MG/5ML PO SUSP: 30.0000 mL | Freq: Every day | ORAL | Status: DC | PRN

## 2014-03-23 MED ORDER — NICOTINE POLACRILEX 2 MG MT GUM: 2.0000 mg | CHEWING_GUM | OROMUCOSAL | Status: DC | PRN

## 2014-03-23 MED ORDER — IBUPROFEN 600 MG PO TABS
600.0000 mg | ORAL_TABLET | Freq: Four times a day (QID) | ORAL | Status: DC | PRN
  Administered 2014-03-23 (×2): 600 mg via ORAL
  Filled 2014-03-23 (×2): qty 1

## 2014-03-23 MED ORDER — IBUPROFEN 200 MG PO TABS
600.0000 mg | ORAL_TABLET | Freq: Four times a day (QID) | ORAL | Status: DC | PRN
  Administered 2014-03-23: 600 mg via ORAL
  Filled 2014-03-23: qty 3

## 2014-03-23 MED ORDER — ACETAMINOPHEN 325 MG PO TABS: 650.0000 mg | ORAL_TABLET | Freq: Four times a day (QID) | ORAL | Status: DC | PRN

## 2014-03-23 MED ORDER — VENLAFAXINE HCL ER 75 MG PO CP24
75.0000 mg | ORAL_CAPSULE | Freq: Every day | ORAL | Status: DC
  Filled 2014-03-23: qty 1

## 2014-03-23 MED ORDER — ZOLPIDEM TARTRATE 5 MG PO TABS
5.0000 mg | ORAL_TABLET | Freq: Every evening | ORAL | Status: DC | PRN
  Administered 2014-03-23: 5 mg via ORAL
  Filled 2014-03-23: qty 1

## 2014-03-23 MED ORDER — VENLAFAXINE HCL ER 75 MG PO CP24
75.0000 mg | ORAL_CAPSULE | Freq: Every day | ORAL | Status: DC
  Administered 2014-03-24: 75 mg via ORAL
  Filled 2014-03-23: qty 1
  Filled 2014-03-23: qty 4
  Filled 2014-03-23: qty 1

## 2014-03-23 MED ORDER — HYDROXYZINE HCL 50 MG PO TABS
50.0000 mg | ORAL_TABLET | Freq: Three times a day (TID) | ORAL | Status: DC | PRN
  Administered 2014-03-23 – 2014-03-24 (×3): 50 mg via ORAL
  Filled 2014-03-23 (×2): qty 1
  Filled 2014-03-23: qty 6
  Filled 2014-03-23: qty 1

## 2014-03-23 MED ORDER — GABAPENTIN 100 MG PO CAPS: 200.0000 mg | ORAL_CAPSULE | Freq: Three times a day (TID) | ORAL | Status: DC

## 2014-03-23 NOTE — [facility] Counselor (Signed)
[facility] Jannifer FranklinAkintayo and Julieanne CottonJosephine, NP. The room assignment is 401-2. Support paperwork completed. Nursing report [phone].

## 2014-03-23 NOTE — ED Notes (Signed)
Up on the phone 

## 2014-03-23 NOTE — ED Notes (Signed)
Up to the bathroom to shower and change clothes
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
## 2014-03-23 NOTE — ED Notes (Signed)
[facility] w/ driver.
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
## 2014-03-23 NOTE — Tx Team (Addendum)
Initial Interdisciplinary Treatment Plan   PATIENT STRESSORS: Legal issue Marital or family conflict Medication change or noncompliance Substance abuse   PATIENT STRENGTHS: Average or above average intelligence Physical Health Supportive family/friends   PROBLEM LIST: Problem List/Patient Goals Date to be addressed Date deferred Reason deferred Estimated date of resolution  "right medications" 03/23/2104     "I am worried about the night  sweats" 03/23/2014           Risk for suicide      depression                               DISCHARGE CRITERIA:  Improved stabilization in mood, thinking, and/or behavior Need for constant or close observation no longer present Verbal commitment to aftercare and medication compliance  PRELIMINARY DISCHARGE PLAN: Outpatient therapy Return to previous living arrangement  PATIENT/FAMIILY INVOLVEMENT: This treatment plan has been presented to and reviewed with the patient, Marcus ShelterLonnie R [name].  The patient has been given the opportunity to ask questions and make suggestions.  Jule SerKent, Vivian Gail 03/23/2014, 4:38 PM

## 2014-03-23 NOTE — Progress Notes (Signed)
[facility] Group Notes:  (Nursing/MHT/Case Management/Adjunct)  Date:  03/23/2014  Time: 2030 Type of Therapy:  wrap up group  Participation Level:  Active  Participation Quality:  Appropriate, Attentive, Sharing and Supportive  Affect:  Appropriate  Cognitive:  Appropriate  Insight:  Lacking  Engagement in Group:  Engaged  Modes of Intervention:  Clarification, Education and Support  Summary of Progress/Problems: Pt shared that he was happy to finally be here [facility]. Pt reported having been sober but ended up in jail after relapsing and getting in trouble with his anger.  Pt shared that he was having a rough time dealing with his sober emotions. Finding a sponsor was recommended to him by this Clinical research associatewriter but the pt responded that he had a Engineer, drillingprobation officer for that.   Shelah LewandowskySquires, Perris Tripathi Carol 03/23/2014, 9:39 PM

## 2014-03-24 DIAGNOSIS — F329 Major depressive disorder, single episode, unspecified: Secondary | ICD-10-CM

## 2014-03-24 MED ORDER — VENLAFAXINE HCL ER 75 MG PO CP24: 75.0000 mg | ORAL_CAPSULE | Freq: Every day | ORAL | Status: DC

## 2014-03-24 MED ORDER — GABAPENTIN 400 MG PO CAPS
400.0000 mg | ORAL_CAPSULE | Freq: Three times a day (TID) | ORAL | Status: DC
  Administered 2014-03-24: 400 mg via ORAL
  Filled 2014-03-24 (×2): qty 12
  Filled 2014-03-24 (×3): qty 1
  Filled 2014-03-24: qty 12

## 2014-03-24 MED ORDER — ZOLPIDEM TARTRATE 10 MG PO TABS: 10.0000 mg | ORAL_TABLET | Freq: Every evening | ORAL | Status: DC | PRN

## 2014-03-24 MED ORDER — GABAPENTIN 400 MG PO CAPS: 400.0000 mg | ORAL_CAPSULE | Freq: Three times a day (TID) | ORAL | Status: DC

## 2014-03-24 MED ORDER — HYDROXYZINE HCL 50 MG PO TABS: 50.0000 mg | ORAL_TABLET | Freq: Three times a day (TID) | ORAL | Status: DC | PRN

## 2014-03-24 NOTE — Progress Notes (Signed)
Pt was discharged home today.  He denied any S/I H/I or A/V hallucinations. He was given, rx, sample medications, and hotline info booklet.  He voiced understanding to all instructions provided.  He declined the need for smoking cessation materials.
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
## 2014-03-24 NOTE — Progress Notes (Signed)
D: Pt denies SI/HI/AVH. Pt is pleasant and cooperative. Pt interacting on unit with peers. Pt appears to be getting acclimated to the unit and his surroundings.   A: Pt was offered support and encouragement. Pt was given scheduled medications. Pt was encourage to attend groups. Q 15 minute checks were done for safety.   R:Pt attends groups and interacts well with peers and staff. Pt is taking medication. Pt has no complaints at this time .Pt receptive to treatment and safety maintained on unit.

## 2014-03-24 NOTE — [facility] Group Notes (Signed)
   [facility]  03/24/2014  8:45 AM   Participation Quality: Alert, Appropriate and Oriented  Mood/Affect: Appropriate  Depression Rating: 5  Anxiety Rating: 9 (baseline per patient)  Thoughts of Suicide: Pt denies SI/HI  Will you contract for safety? Yes  Current AVH: Pt denies  Plan for Discharge/Comments: Pt attended discharge planning group and actively participated in group. CSW provided pt with today's workbook. Patient reports that he feels "okay" today and was hospitalized due to depression and was recently hospitalized 3 months ago. He reports that he has a safe place to stay and will follow up with outpatient services yet to be determined.  Transportation Means: Pt reports access to transportation  Supports: No supports mentioned at this time  Samuella BruinKristin Zailey Audia, MSW, [employer]CSWA Clinical Social Worker [facility] [phone]
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
## 2014-03-24 NOTE — Progress Notes (Signed)
Pt stated he had the best sleep he had in a long time. Pt stated he went to sleep and stayed asleep.

## 2014-03-24 NOTE — Discharge Summary (Signed)
Physician Discharge Summary Note  Patient:  Marcus [name] is an 44 y.o., male MRN:  [id number] DOB:  06/03/1970 Patient phone:  [phone] (home)  Patient address:   [name] [place] [address],  Total Time spent with patient: Greater than 30 minutes  Date of Admission:  03/23/2014  Date of Discharge: 03/24/14  Reason for Admission: To re-start on medications  Principal Problem: MDD (major depressive disorder) Discharge Diagnoses: Patient Active Problem List   Diagnosis Date Noted  . Opiate abuse, continuous [F11.10] 05/20/2012    Priority: High    Class: Acute  . 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 Alcohol intoxication [F10.129]   . Overdose [T50.901A]   . 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 Opioid dependence with opioid-induced mood disorder [F11.24]   . MDD (major depressive disorder) [F32.2] 01/11/2014   Musculoskeletal: Strength & Muscle Tone: within normal limits Gait & Station: normal Patient leans: N/A  Psychiatric Specialty Exam: Physical Exam  Psychiatric: His speech is normal and behavior is normal. Judgment and thought content normal. His mood appears not anxious. His affect is not angry, not blunt, not labile and not inappropriate. Cognition and memory are normal. He does not exhibit a depressed mood.    Review of Systems  Constitutional: Negative.   HENT: Negative.   Eyes: Negative.   Respiratory: Negative.   Cardiovascular: Negative.   Gastrointestinal: Negative.   Genitourinary: Negative.   Musculoskeletal: Negative.   Skin: Negative.   Neurological: Negative.   Endo/Heme/Allergies: Negative.   Psychiatric/Behavioral: Positive for depression (Stable) and substance abuse (Alcoholism, chronic). Negative for suicidal ideas, hallucinations and memory loss. The patient has insomnia  (Stable). The patient is not nervous/anxious.     Blood pressure 145/91, pulse 80, temperature 97.1 F (36.2 C), temperature source Oral, resp. rate 20, height 6' (1.829 m), weight 85.73 kg (189 lb), SpO2 99 %.Body mass index is 25.63 kg/(m^2).  See Md's SRA   Past Medical History:  Past Medical History  Diagnosis Date  . Chronic back pain   . Suicidal overdose     Past Surgical History  Procedure Laterality Date  . None     Family History: History reviewed. No pertinent family history. Social History:  History  Alcohol Use  . Yes     History  Drug Use No    Comment: has been off methadone since 11/02/13    History   Social History  . Marital Status: Legally Separated    Spouse Name: N/A    Number of Children: N/A  . Years of Education: N/A   Social History Main Topics  . Smoking status: Never Smoker   . Smokeless tobacco: Current User    Types: Chew  . Alcohol Use: Yes  . Drug Use: No     Comment: has been off methadone since 11/02/13  . Sexual Activity: None   Other Topics Concern  . None   Social History Narrative   Risk to Self: Is patient at risk for suicide?: No  Risk to Others: NO  Prior Inpatient Therapy: Yes  Prior Outpatient Therapy: Yes Level of Care:  OP  Hospital Course:  Caucasian male, [age] was brought in by his mother after finding him lethargic. Patient admitted to consuming Alcohol, Xanax, and Valium Which he took from his grandfather's home. Patient on arrival last night was difficult to arouse.  Patient denied that he took the pills to kill himself but fell short of explaining the reason for his OD. He Has a diagnosis of depression and anxiety and has had issues with anger. Patient recently came back from jail where he served a month for assault with deadly weapon.  Rain is assessed and evaluated. He endorsed that he is doing well. Stated that he came to the hospital to have his medication restarted as he had ran out of all of them. He  stated that he has appointment scheduled for next week with his primary psychiatrist, however, refused to provide any information on this provider. He is currently declining follow-up care appointment and or referral.  Edgardo is being discharged on; Neurontin 400 mg three times daily for aggression/pain management, Hydroxyzine 50 mg qid for anxiety, Effexor XR 75 mg daily for depression and Ambien 10 mg Q hs for insomnia. Kerri presented no other serious pre-existing medical issues that required treatment & or monitoring. He is being discharged to his home with his wife. Upon discharge, he adamantly denies any SIHI, AVH, delusional thoughts, paranoia and or substance withdrawal symptoms. He received a 14 days worth, supply samples of his [facility] discharge medications. He left [facility] with all personal belongings in no distress. Transportation per patient arrangement.  Consults:  psychiatry  Significant Diagnostic Studies:  labs: CBC with diff, CMP, UDS, toxicology tests, U/A, results reviewed, stable  Discharge Vitals:   Blood pressure 145/91, pulse 80, temperature 97.1 F (36.2 C), temperature source Oral, resp. rate 20, height 6' (1.829 m), weight 85.73 kg (189 lb), SpO2 99 %. Body mass index is 25.63 kg/(m^2). Lab Results:   No results found for this or any previous visit (from the past 72 hour(s)).  Physical Findings: AIMS: Facial and Oral Movements Muscles of Facial Expression: None, normal Lips and Perioral Area: None, normal Jaw: None, normal Tongue: None, normal,Extremity Movements Upper (arms, wrists, hands, fingers): None, normal Lower (legs, knees, ankles, toes): None, normal, Trunk Movements Neck, shoulders, hips: None, normal, Overall Severity Severity of abnormal movements (highest score from questions above): None, normal Incapacitation due to abnormal movements: None, normal Patient's awareness of abnormal movements (rate only patient's report): No Awareness, Dental Status Current  problems with teeth and/or dentures?: No Does patient usually wear dentures?: No  CIWA:  CIWA-Ar Total: 3 COWS:      See Psychiatric Specialty Exam and Suicide Risk Assessment completed by Attending Physician prior to discharge.  Discharge destination:  Home  Is patient on multiple antipsychotic therapies at discharge:  No   Has Patient had three or more failed trials of antipsychotic monotherapy by history:  No  Recommended Plan for Multiple Antipsychotic Therapies: NA    Medication List    STOP taking these medications        clonazePAM 1 MG tablet  Commonly known as:  KLONOPIN     methocarbamol 500 MG tablet  Commonly known as:  ROBAXIN      TAKE these medications      Indication   gabapentin 400 MG capsule  Commonly known as:  NEURONTIN  Take 1 capsule (400 mg total) by mouth 3 (three) times daily. For agitation/chronic pain   Indication:  Aggressive Behavior, Agitation, chronic pain     hydrOXYzine 50 MG tablet  Commonly known as:  ATARAX/VISTARIL  Take 1 tablet (50 mg total) by mouth every 8 (eight) hours as needed for anxiety.   Indication:  Anxiety     venlafaxine XR 75 MG  24 hr capsule  Commonly known as:  EFFEXOR-XR  Take 1 capsule (75 mg total) by mouth daily with breakfast. For depression   Indication:  Major Depressive Disorder     zolpidem 10 MG tablet  Commonly known as:  AMBIEN  Take 1 tablet (10 mg total) by mouth at bedtime as needed for sleep.   Indication:  Trouble Sleeping       Follow-up Information    Follow up with Patient reports that he already has outpatient appointments set up at this time with an unknown provider and has declined for CSW to make additional provider referrals..    Follow-up recommendations:    Comments: Take all your medications as prescribed by your mental healthcare provider. Report any adverse effects and or reactions from your medicines to your outpatient provider promptly. Patient is instructed and cautioned  to not engage in alcohol and or illegal drug use while on prescription medicines. In the event of worsening symptoms, patient is instructed to call the crisis hotline, 911 and or go to the nearest ED for appropriate evaluation and treatment of symptoms. Follow-up with your primary care provider for your other medical issues, concerns and or health care needs.   Total Discharge Time: Greater than 30 minutes  Signed: Sanjuana Kava, PMHNP-BC 03/24/2014, 3:24 PM  I personally assessed the patient and formulated the plan Madie Reno A. Dub Mikes, M.D.

## 2014-03-24 NOTE — [facility] Suicide Risk Assessment (Signed)
[facility] INPATIENT:  Family/Significant Other Suicide Prevention Education  Suicide Prevention Education:  Patient Refusal for Family/Significant Other Suicide Prevention Education: The patient Marcus [name] has refused to provide written consent for family/significant other to be provided Family/Significant Other Suicide Prevention Education during admission and/or prior to discharge.  Physician notified. SPE reviewed with patient and brochure provided.  Yoceline Bazar, [place]in L 03/24/2014, 1:09 PM

## 2014-03-24 NOTE — Plan of Care (Signed)
Problem: Diagnosis: Increased Risk For Suicide Attempt Goal: STG-Patient Will Report Suicidal Feelings to Staff Outcome: Progressing Pt denies any suicidal ideation on his self-inventory and denies any plans here in the hospital.  He does contract verbally to come to staff before acting on any self-harm thoughts.

## 2014-03-24 NOTE — [facility] Counselor (Signed)
Patient discharged prior to 72 hours and PSA was not able to be completed prior to discharge.  Samuella BruinKristin Sherrie Marsan, MSW, [employer]CSWA Clinical Social Worker [facility] [phone]

## 2014-03-24 NOTE — Progress Notes (Signed)
  [facility] :  Will you be returning to the same living situation after discharge:  Yes,  patient plans to return home with his wife At discharge, do you have transportation home?: Yes,  patient reports access to transportation Do you have the ability to pay for your medications: Yes,  patient will be provided with medication samples and prescriptions at discharge  Release of information consent forms completed and in the chart;  Patient's signature needed at discharge.  Patient to Follow up at: Follow-up Information    Follow up with Patient reports that he already has outpatient appointments set up at this time with an unknown provider and has declined for CSW to make additional provider referrals..      Patient denies SI/HI: Yes,  denies    Aeronautical engineerafety Planning and Suicide Prevention discussed: Yes, with patient  Has patient been referred to the Quitline?: Patient refused referral  Khristen Cheyney, [place]in L 03/24/2014, 1:09 PM

## 2014-03-27 NOTE — Progress Notes (Addendum)
Patient Discharge Instructions:  No documentation was faxed for HBIPS.  Per the SW/AVS the patient declined follow up.  Marcus ReddenSheena E [place], 03/27/2014, 3:17 PM

## 2014-07-24 ENCOUNTER — Emergency Department (HOSPITAL_BASED_OUTPATIENT_CLINIC_OR_DEPARTMENT_OTHER): Payer: Medicaid Other

## 2014-07-24 ENCOUNTER — Encounter (HOSPITAL_BASED_OUTPATIENT_CLINIC_OR_DEPARTMENT_OTHER): Payer: Self-pay | Admitting: *Deleted

## 2014-07-24 ENCOUNTER — Emergency Department (HOSPITAL_BASED_OUTPATIENT_CLINIC_OR_DEPARTMENT_OTHER)
Admission: EM | Admit: 2014-07-24 | Discharge: 2014-07-24 | Disposition: A | Discharge status: Home / Self Care | Payer: Medicaid Other | Attending: Emergency Medicine | Admitting: Emergency Medicine

## 2014-07-24 DIAGNOSIS — S96922A Laceration of unspecified muscle and tendon at ankle and foot level, left foot, initial encounter: Secondary | ICD-10-CM | POA: Diagnosis not present

## 2014-07-24 DIAGNOSIS — G8929 Other chronic pain: Secondary | ICD-10-CM | POA: Insufficient documentation

## 2014-07-24 DIAGNOSIS — Y9389 Activity, other specified: Secondary | ICD-10-CM | POA: Diagnosis not present

## 2014-07-24 DIAGNOSIS — S99922A Unspecified injury of left foot, initial encounter: Secondary | ICD-10-CM | POA: Diagnosis present

## 2014-07-24 DIAGNOSIS — W208XXA Other cause of strike by thrown, projected or falling object, initial encounter: Secondary | ICD-10-CM | POA: Insufficient documentation

## 2014-07-24 DIAGNOSIS — Y9289 Other specified places as the place of occurrence of the external cause: Secondary | ICD-10-CM | POA: Diagnosis not present

## 2014-07-24 DIAGNOSIS — Y998 Other external cause status: Secondary | ICD-10-CM | POA: Diagnosis not present

## 2014-07-24 DIAGNOSIS — Z79899 Other long term (current) drug therapy: Secondary | ICD-10-CM | POA: Insufficient documentation

## 2014-07-24 DIAGNOSIS — S96122A Laceration of muscle and tendon of long extensor muscle of toe at ankle and foot level, left foot, initial encounter: Secondary | ICD-10-CM

## 2014-07-24 LAB — BASIC METABOLIC PANEL
ANION GAP: 12 (ref 5–15)
BUN: 10 mg/dL (ref 6–20)
CALCIUM: 9.8 mg/dL (ref 8.9–10.3)
CHLORIDE: 103 mmol/L (ref 101–111)
CO2: 27 mmol/L (ref 22–32)
Creatinine, Ser: 0.89 mg/dL (ref 0.61–1.24)
GFR calc Af Amer: >60 mL/min (ref >60)
Glucose, Bld: 122 mg/dL — ABNORMAL HIGH (ref 65–99)
Potassium: 3.6 mmol/L (ref 3.5–5.1)
SODIUM: 142 mmol/L (ref 135–145)

## 2014-07-24 LAB — CBC WITH DIFFERENTIAL/PLATELET
BASOS ABS: 0 10*3/uL (ref 0.0–0.1)
Basophils Relative: 0 % (ref 0–1)
EOS ABS: 0.1 10*3/uL (ref 0.0–0.7)
EOS PCT: 1 % (ref 0–5)
HEMATOCRIT: 41.6 % (ref 39.0–52.0)
HEMOGLOBIN: 14 g/dL (ref 13.0–17.0)
Lymphocytes Relative: 22 % (ref 12–46)
Lymphs Abs: 2 10*3/uL (ref 0.7–4.0)
MCH: 28.6 pg (ref 26.0–34.0)
MCHC: 33.7 g/dL (ref 30.0–36.0)
MCV: 85.1 fL (ref 78.0–100.0)
MONO ABS: 0.6 10*3/uL (ref 0.1–1.0)
Monocytes Relative: 6 % (ref 3–12)
NEUTROS ABS: 6.5 10*3/uL (ref 1.7–7.7)
NEUTROS PCT: 71 % (ref 43–77)
Platelets: 294 10*3/uL (ref 150–400)
RBC: 4.89 MIL/uL (ref 4.22–5.81)
RDW: 13.1 % (ref 11.5–15.5)
WBC: 9.1 10*3/uL (ref 4.0–10.5)

## 2014-07-24 MED ORDER — HYDROMORPHONE HCL 1 MG/ML IJ SOLN
1.0000 mg | Freq: Once | INTRAMUSCULAR | Status: AC
Start: 2014-07-24 — End: 2014-07-24
  Administered 2014-07-24: 1 mg via INTRAVENOUS
  Filled 2014-07-24: qty 1

## 2014-07-24 MED ORDER — OXYCODONE-ACETAMINOPHEN 5-325 MG PO TABS: 2.0000 | ORAL_TABLET | ORAL | Status: DC | PRN

## 2014-07-24 MED ORDER — LIDOCAINE-EPINEPHRINE 2 %-1:100000 IJ SOLN
INTRAMUSCULAR | Status: AC
  Administered 2014-07-24: 15:00:00
  Filled 2014-07-24: qty 1

## 2014-07-24 MED ORDER — OXYCODONE-ACETAMINOPHEN 5-325 MG PO TABS
2.0000 | ORAL_TABLET | Freq: Once | ORAL | Status: AC
  Administered 2014-07-24: 2 via ORAL
  Filled 2014-07-24: qty 2

## 2014-07-24 MED ORDER — HYDROMORPHONE HCL 1 MG/ML IJ SOLN
1.0000 mg | Freq: Once | INTRAMUSCULAR | Status: AC
  Administered 2014-07-24: 1 mg via INTRAVENOUS
  Filled 2014-07-24: qty 1

## 2014-07-24 NOTE — ED Provider Notes (Signed)
LACERATION REPAIR Performed by: Lottie MusselKIRICHENKO, Stephannie Broner A Authorized by: Jaynie CrumbleKIRICHENKO, Zakariya Knickerbocker A Consent: Verbal consent obtained. Risks and benefits: risks, benefits and alternatives were discussed Consent given by: patient Patient identity confirmed: provided demographic data Prepped and Draped in normal sterile fashion Wound explored  Laceration Location: left foot  Laceration Length: 4cm  No Foreign Bodies seen or palpated  Anesthesia: local infiltration  Local anesthetic: lidocaine 2% w epinephrine  Anesthetic total: 3 ml  Irrigation method: syringe Amount of cleaning: standard  Skin closure: prolene 4.0  Number of sutures: 5  Technique: simple interrupted  Patient tolerance: Patient tolerated the procedure well with no immediate complications.   Jaynie Crumbleatyana Haja Crego, PA-C 07/24/14 1605  Arby BarretteMarcy Pfeiffer, MD 07/24/14 [phone]

## 2014-07-24 NOTE — ED Notes (Signed)
Laceration to left foot last night.  Pt is very agitated, cursing at [employer].

## 2014-07-24 NOTE — Discharge Instructions (Signed)
You have cut the tendon that controls your big toe. You will have to have a repair for normal function of the toe again. Call Dr. Deri FuellingAluisio's office as soon as it opens in the morning to schedule your repair tomorrow. Leave bandage in place until you see Dr. for recheck tomorrow. Elevate your foot. Wear the orthopedic shoe until your follow-up.   Laceration Care, Adult A laceration is a cut or lesion that goes through all layers of the skin and into the tissue just beneath the skin. TREATMENT  Some lacerations may not require closure. Some lacerations may not be able to be closed due to an increased risk of infection. It is important to see your caregiver as soon as possible after an injury to minimize the risk of infection and maximize the opportunity for successful closure. If closure is appropriate, pain medicines may be given, if needed. The wound will be cleaned to help prevent infection. Your caregiver will use stitches (sutures), staples, wound glue (adhesive), or skin adhesive strips to repair the laceration. These tools bring the skin edges together to allow for faster healing and a better cosmetic outcome. However, all wounds will heal with a scar. Once the wound has healed, scarring can be minimized by covering the wound with sunscreen during the day for 1 full year.   SEEK IMMEDIATE MEDICAL CARE IF:   Your pain is not controlled with prescribed medicine.  You have severe swelling around the wound causing pain and numbness or a change in color in your arm, hand, leg, or foot.  Your wound splits open and starts bleeding.  You have worsening numbness, weakness, or loss of function of any joint around or beyond the wound.  You develop painful lumps near the wound or on the skin anywhere on your body. MAKE SURE YOU:   Understand these instructions.  Will watch your condition.  Will get help right away if you are not doing well or get worse. Document Released: 02/10/2005 Document  Revised: 05/05/2011 Document Reviewed: 08/06/2010 [facility] Patient Information 2015 [place]. This information is not intended to replace advice given to you by your health care provider. Make sure you discuss any questions you have with your health care provider.

## 2014-07-24 NOTE — ED Provider Notes (Signed)
CSN: [id number]     Arrival date & time 07/24/14  1256 History   First MD Initiated Contact with Patient 07/24/14 1319     Chief Complaint  Patient presents with  . Extremity Laceration     (Consider location/radiation/quality/duration/timing/severity/associated sxs/prior Treatment) HPI The patient reports that he dropped a glass based on his foot last night and it was bleeding profusely. He taped it up but reports he was unable to come to the emergency department last night because he didn't have a ride. He states since he did that he has been unable to move his big toe. He reports the pain is excruciating. He denies any other injury occurred. He denies drug use or ingestion. Past Medical History  Diagnosis Date  . Chronic back pain   . Suicidal overdose    Past Surgical History  Procedure Laterality Date  . None     History reviewed. No pertinent family history. History  Substance Use Topics  . Smoking status: Never Smoker   . Smokeless tobacco: Current User    Types: Chew  . Alcohol Use: Yes    Review of Systems 10 Systems reviewed and are negative for acute change except as noted in the HPI.    Allergies  Benadryl; Seroquel; and Trazodone and nefazodone  Home Medications   Prior to Admission medications   Medication Sig Start Date End Date Taking? Authorizing Provider  gabapentin (NEURONTIN) 400 MG capsule Take 1 capsule (400 mg total) by mouth 3 (three) times daily. For agitation/chronic pain 03/24/14   Sanjuana Kava, NP  hydrOXYzine (ATARAX/VISTARIL) 50 MG tablet Take 1 tablet (50 mg total) by mouth every 8 (eight) hours as needed for anxiety. 03/24/14   Sanjuana Kava, NP  oxyCODONE-acetaminophen (PERCOCET) 5-325 MG per tablet Take 2 tablets by mouth every 4 (four) hours as needed. 07/24/14   Arby Barrette, MD  venlafaxine XR (EFFEXOR-XR) 75 MG 24 hr capsule Take 1 capsule (75 mg total) by mouth daily with breakfast. For depression 03/24/14   Sanjuana Kava, NP   zolpidem (AMBIEN) 10 MG tablet Take 1 tablet (10 mg total) by mouth at bedtime as needed for sleep. 03/24/14 04/23/14  Sanjuana Kava, NP   BP 118/79 mmHg  Pulse 76  Temp(Src) 99 F (37.2 C) (Oral)  Resp 18  Ht 6' (1.829 m)  Wt 205 lb (92.987 kg)  BMI 27.80 kg/m2  SpO2 99% Physical Exam  Constitutional: He is oriented to person, place, and time.  Patient is disproportionately agitated and hostile. He is alert and hyperventilating.  HENT:  Head: Normocephalic and atraumatic.  Nose: Nose normal.  Eyes:  Pupils are dilated. Extraocular motions are intact.  Neck: Neck supple.  Pulmonary/Chest: Effort normal.  Musculoskeletal:  Other than foot laceration no extremity deformities. Other extremities are being used purposefully and without limitation. Deep laceration to the dorsum of the foot proximally 1 cm from the first metatarsal joint, laceration 4 cm. No active arterial bleeding. Slow venous bleeding with exploration of the wound. Extensor hallucis longus tendon laceration present. The great toe is flaccid without any ability to extend.  Neurological: He is alert and oriented to person, place, and time. No cranial nerve deficit. He exhibits normal muscle tone. Coordination normal.  Skin: Skin is warm and dry.  Psychiatric:  The patient is disproportionately agitated with behavior inappropriate to situation.    ED Course  Procedures (including critical care time) Labs Review Labs Reviewed  BASIC METABOLIC PANEL - Abnormal; Notable for  the following:    Glucose, Bld 122 (*)    All other components within normal limits  CBC WITH DIFFERENTIAL/PLATELET    Imaging Review Dg Foot Complete Left  07/24/2014   CLINICAL DATA:  The patient dropped a vase on his left foot 07/23/2014. Pain. Initial encounter.  EXAM: LEFT FOOT - COMPLETE 3+ VIEW  COMPARISON:  None.  FINDINGS: Imaged bones, joints and soft tissues appear normal.  IMPRESSION: Negative exam.   Electronically Signed   By: Drusilla Kannerhomas   Dalessio M.D.   On: 07/24/2014 14:07     EKG Interpretation None     Consult: Patient's case was reviewed with Dr. Despina HickAlusio, he advises the laceration can be superficially closed in the emergency department and dressed. The patient can then be seen in the office tomorrow for tendon repair. MDM   Final diagnoses:  Laceration of extensor hallucis longus tendon, left, initial encounter   Patient with tendon laceration as described. The wound was cleaned and superficially closed. Once the patient was given Dilaudid IV his demeanor improved significantly. Patient was evaluated for other injury or cause of his agitation. At this time this appears to be secondary to disproportionate response to pain and anxiety.    Arby BarretteMarcy Deolinda Frid, MD 07/24/14 [phone]

## 2014-07-26 ENCOUNTER — Emergency Department (HOSPITAL_BASED_OUTPATIENT_CLINIC_OR_DEPARTMENT_OTHER)
Admission: EM | Admit: 2014-07-26 | Discharge: 2014-07-26 | Discharge status: Against Medical Advice | Payer: Medicaid Other | Attending: Emergency Medicine | Admitting: Emergency Medicine

## 2014-07-26 ENCOUNTER — Encounter (HOSPITAL_BASED_OUTPATIENT_CLINIC_OR_DEPARTMENT_OTHER): Payer: Self-pay | Admitting: Emergency Medicine

## 2014-07-26 DIAGNOSIS — G8929 Other chronic pain: Secondary | ICD-10-CM | POA: Diagnosis not present

## 2014-07-26 DIAGNOSIS — M79672 Pain in left foot: Secondary | ICD-10-CM | POA: Diagnosis not present

## 2014-07-26 DIAGNOSIS — Z79899 Other long term (current) drug therapy: Secondary | ICD-10-CM | POA: Insufficient documentation

## 2014-07-26 DIAGNOSIS — Z87828 Personal history of other (healed) physical injury and trauma: Secondary | ICD-10-CM | POA: Diagnosis not present

## 2014-07-26 DIAGNOSIS — Z0000 Encounter for general adult medical examination without abnormal findings: Secondary | ICD-10-CM | POA: Diagnosis present

## 2014-07-26 NOTE — ED Notes (Signed)
Teressa LowerVrinda Pickering, NP at bedside to evaluate pt.  Pt yelling and cursing at staff.  Security called and pt witnessed ambulating from ED to parking lot.

## 2014-07-26 NOTE — ED Provider Notes (Signed)
CSN: [id number]     Arrival date & time 07/26/14  1045 History   First MD Initiated Contact with Patient 07/26/14 1201     Chief Complaint  Patient presents with  . Follow-up     (Consider location/radiation/quality/duration/timing/severity/associated sxs/prior Treatment) HPI Comments: Pt states that he was seen 2 days ago and had a tendon injury to his left foot and he was given follow up with orthopedist. He said that he can't afford to go to ortho and he is out of his pain medication. So he is here for more pain medication. Denies any problems with infection of the wound.  The history is provided by the patient. No language interpreter was used.    Past Medical History  Diagnosis Date  . Chronic back pain   . Suicidal overdose    Past Surgical History  Procedure Laterality Date  . None     History reviewed. No pertinent family history. History  Substance Use Topics  . Smoking status: Never Smoker   . Smokeless tobacco: Current User    Types: Chew  . Alcohol Use: Yes    Review of Systems  All other systems reviewed and are negative.     Allergies  Benadryl; Seroquel; and Trazodone and nefazodone  Home Medications   Prior to Admission medications   Medication Sig Start Date End Date Taking? Authorizing Provider  gabapentin (NEURONTIN) 400 MG capsule Take 1 capsule (400 mg total) by mouth 3 (three) times daily. For agitation/chronic pain 03/24/14   Sanjuana Kava, NP  hydrOXYzine (ATARAX/VISTARIL) 50 MG tablet Take 1 tablet (50 mg total) by mouth every 8 (eight) hours as needed for anxiety. 03/24/14   Sanjuana Kava, NP  oxyCODONE-acetaminophen (PERCOCET) 5-325 MG per tablet Take 2 tablets by mouth every 4 (four) hours as needed. 07/24/14   Arby Barrette, MD  venlafaxine XR (EFFEXOR-XR) 75 MG 24 hr capsule Take 1 capsule (75 mg total) by mouth daily with breakfast. For depression 03/24/14   Sanjuana Kava, NP  zolpidem (AMBIEN) 10 MG tablet Take 1 tablet (10 mg total) by  mouth at bedtime as needed for sleep. 03/24/14 04/23/14  Sanjuana Kava, NP   BP 119/74 mmHg  Pulse 70  Temp(Src) 98.2 F (36.8 C) (Oral)  Resp 18  Ht 6' (1.829 m)  Wt 200 lb (90.719 kg)  BMI 27.12 kg/m2  SpO2 100% Physical Exam  Constitutional: He is oriented to person, place, and time. He appears well-developed and well-nourished.  Cardiovascular: Normal rate and regular rhythm.   Pulmonary/Chest: Effort normal and breath sounds normal.  Neurological: He is alert and oriented to person, place, and time.  Skin:  Well healing wound to the left foot. Unable to extend left great toe  Nursing note and vitals reviewed.   ED Course  Procedures (including critical care time) Labs Review Labs Reviewed - No data to display  Imaging Review Dg Foot Complete Left  07/24/2014   CLINICAL DATA:  The patient dropped a vase on his left foot 07/23/2014. Pain. Initial encounter.  EXAM: LEFT FOOT - COMPLETE 3+ VIEW  COMPARISON:  None.  FINDINGS: Imaged bones, joints and soft tissues appear normal.  IMPRESSION: Negative exam.   Electronically Signed   By: Drusilla Kanner M.D.   On: 07/24/2014 14:07     EKG Interpretation None      MDM   Final diagnoses:  Left foot pain   Discussed that we would not be giving pt more narcotics today. Pt  left prior to getting discharge paper. Pt has history of abuse and overdose and discussed follow up with ortho     Teressa LowerVrinda Hadleigh Felber, NP 07/26/14 1220  Doug SouSam Jacubowitz, MD 07/26/14 1502

## 2014-07-26 NOTE — Discharge Instructions (Signed)

## 2014-07-26 NOTE — ED Notes (Signed)
Family concerned about wait time. 

## 2014-07-26 NOTE — ED Notes (Signed)
Pt states he is having on going foot pain from previous injury.  Unable to f/u with surgeon due to insurance issues and is having pain.

## 2014-07-27 ENCOUNTER — Ambulatory Visit: Payer: Self-pay | Admitting: Orthopedic Surgery

## 2014-07-27 ENCOUNTER — Encounter (HOSPITAL_COMMUNITY): Payer: Self-pay | Admitting: *Deleted

## 2014-07-27 NOTE — Progress Notes (Signed)
Preoperative surgical orders have been place into the Epic hospital system for [facility] on 07/27/2014, 2:07 PM  by Patrica DuelPERKINS, ALEXZANDREW for surgery on 07-31-2014.  Preop foot orders including IV Tylenol and IV Decadron as long as there are no contraindications to the above medications. Avel Peacerew Perkins, PA-C

## 2014-07-31 ENCOUNTER — Ambulatory Visit (HOSPITAL_COMMUNITY): Payer: Medicaid Other | Admitting: Anesthesiology

## 2014-07-31 ENCOUNTER — Ambulatory Visit (HOSPITAL_COMMUNITY)
Admission: RE | Admit: 2014-07-31 | Discharge: 2014-07-31 | Disposition: A | Discharge status: Home / Self Care | Payer: Medicaid Other | Source: Ambulatory Visit | Attending: Orthopedic Surgery | Admitting: Orthopedic Surgery

## 2014-07-31 ENCOUNTER — Encounter (HOSPITAL_COMMUNITY)
Admission: RE | Disposition: A | Discharge status: Home / Self Care | Payer: Self-pay | Source: Ambulatory Visit | Attending: Orthopedic Surgery

## 2014-07-31 ENCOUNTER — Encounter (HOSPITAL_COMMUNITY): Payer: Self-pay | Admitting: *Deleted

## 2014-07-31 DIAGNOSIS — G8929 Other chronic pain: Secondary | ICD-10-CM | POA: Diagnosis not present

## 2014-07-31 DIAGNOSIS — Y999 Unspecified external cause status: Secondary | ICD-10-CM | POA: Diagnosis not present

## 2014-07-31 DIAGNOSIS — S96929A Laceration of unspecified muscle and tendon at ankle and foot level, unspecified foot, initial encounter: Secondary | ICD-10-CM

## 2014-07-31 DIAGNOSIS — Z87891 Personal history of nicotine dependence: Secondary | ICD-10-CM | POA: Diagnosis not present

## 2014-07-31 DIAGNOSIS — Y929 Unspecified place or not applicable: Secondary | ICD-10-CM | POA: Insufficient documentation

## 2014-07-31 DIAGNOSIS — Z79891 Long term (current) use of opiate analgesic: Secondary | ICD-10-CM | POA: Insufficient documentation

## 2014-07-31 DIAGNOSIS — S91319A Laceration without foreign body, unspecified foot, initial encounter: Secondary | ICD-10-CM | POA: Diagnosis present

## 2014-07-31 DIAGNOSIS — W25XXXA Contact with sharp glass, initial encounter: Secondary | ICD-10-CM | POA: Insufficient documentation

## 2014-07-31 DIAGNOSIS — S96122A Laceration of muscle and tendon of long extensor muscle of toe at ankle and foot level, left foot, initial encounter: Secondary | ICD-10-CM | POA: Diagnosis not present

## 2014-07-31 DIAGNOSIS — M79672 Pain in left foot: Secondary | ICD-10-CM | POA: Diagnosis present

## 2014-07-31 DIAGNOSIS — M549 Dorsalgia, unspecified: Secondary | ICD-10-CM | POA: Diagnosis not present

## 2014-07-31 DIAGNOSIS — Y939 Activity, unspecified: Secondary | ICD-10-CM | POA: Insufficient documentation

## 2014-07-31 HISTORY — PX: TENDON REPAIR: SHX5111

## 2014-07-31 SURGERY — TENDON REPAIR
Anesthesia: General | Site: Foot | Laterality: Left

## 2014-07-31 MED ORDER — 0.9 % SODIUM CHLORIDE (POUR BTL) OPTIME
TOPICAL | Status: DC | PRN
  Administered 2014-07-31: 1000 mL

## 2014-07-31 MED ORDER — FENTANYL CITRATE (PF) 100 MCG/2ML IJ SOLN
INTRAMUSCULAR | Status: DC | PRN
  Administered 2014-07-31: 100 ug via INTRAVENOUS

## 2014-07-31 MED ORDER — MIDAZOLAM HCL 2 MG/2ML IJ SOLN
INTRAMUSCULAR | Status: AC
  Filled 2014-07-31: qty 2

## 2014-07-31 MED ORDER — OXYCODONE-ACETAMINOPHEN 5-325 MG PO TABS
2.0000 | ORAL_TABLET | Freq: Once | ORAL | Status: AC
  Administered 2014-07-31: 2 via ORAL
  Filled 2014-07-31: qty 2

## 2014-07-31 MED ORDER — PROPOFOL 10 MG/ML IV BOLUS
INTRAVENOUS | Status: AC
  Filled 2014-07-31: qty 20

## 2014-07-31 MED ORDER — FENTANYL CITRATE (PF) 100 MCG/2ML IJ SOLN
INTRAMUSCULAR | Status: AC
  Filled 2014-07-31: qty 2

## 2014-07-31 MED ORDER — HYDROMORPHONE HCL 1 MG/ML IJ SOLN
0.2500 mg | INTRAMUSCULAR | Status: DC | PRN
  Administered 2014-07-31 (×4): 0.5 mg via INTRAVENOUS

## 2014-07-31 MED ORDER — ACETAMINOPHEN 10 MG/ML IV SOLN
1000.0000 mg | Freq: Once | INTRAVENOUS | Status: AC
  Administered 2014-07-31: 1000 mg via INTRAVENOUS
  Filled 2014-07-31: qty 100

## 2014-07-31 MED ORDER — HYDROMORPHONE HCL 1 MG/ML IJ SOLN
INTRAMUSCULAR | Status: AC
  Filled 2014-07-31: qty 1

## 2014-07-31 MED ORDER — ACETAMINOPHEN 10 MG/ML IV SOLN
INTRAVENOUS | Status: AC
  Filled 2014-07-31: qty 100

## 2014-07-31 MED ORDER — MIDAZOLAM HCL 5 MG/5ML IJ SOLN
INTRAMUSCULAR | Status: DC | PRN
  Administered 2014-07-31: 2 mg via INTRAVENOUS

## 2014-07-31 MED ORDER — BUPIVACAINE HCL 0.5 % IJ SOLN
INTRAMUSCULAR | Status: DC | PRN
  Administered 2014-07-31: 20 mL

## 2014-07-31 MED ORDER — HYDROMORPHONE HCL 1 MG/ML IJ SOLN
INTRAMUSCULAR | Status: DC | PRN
  Administered 2014-07-31 (×2): 1 mg via INTRAVENOUS

## 2014-07-31 MED ORDER — CHLORHEXIDINE GLUCONATE 4 % EX LIQD: 60.0000 mL | Freq: Once | CUTANEOUS | Status: DC

## 2014-07-31 MED ORDER — SODIUM CHLORIDE 0.9 % IV SOLN: INTRAVENOUS | Status: DC

## 2014-07-31 MED ORDER — FENTANYL CITRATE (PF) 100 MCG/2ML IJ SOLN
25.0000 ug | INTRAMUSCULAR | Status: DC | PRN
Start: 2014-07-31 — End: 2014-07-31

## 2014-07-31 MED ORDER — CEFAZOLIN SODIUM-DEXTROSE 2-3 GM-% IV SOLR
INTRAVENOUS | Status: AC
  Filled 2014-07-31: qty 50

## 2014-07-31 MED ORDER — DEXAMETHASONE SODIUM PHOSPHATE 10 MG/ML IJ SOLN: 10.0000 mg | Freq: Once | INTRAMUSCULAR | Status: DC

## 2014-07-31 MED ORDER — BUPIVACAINE HCL (PF) 0.5 % IJ SOLN
INTRAMUSCULAR | Status: AC
  Filled 2014-07-31: qty 30

## 2014-07-31 MED ORDER — CEFAZOLIN SODIUM-DEXTROSE 2-3 GM-% IV SOLR
2.0000 g | INTRAVENOUS | Status: AC
  Administered 2014-07-31: 2 g via INTRAVENOUS

## 2014-07-31 MED ORDER — OXYCODONE-ACETAMINOPHEN 5-325 MG PO TABS: 1.0000 | ORAL_TABLET | Freq: Four times a day (QID) | ORAL | Status: DC | PRN

## 2014-07-31 MED ORDER — LIDOCAINE HCL (CARDIAC) 20 MG/ML IV SOLN
INTRAVENOUS | Status: DC | PRN
  Administered 2014-07-31: 60 mg via INTRAVENOUS

## 2014-07-31 MED ORDER — PROPOFOL 10 MG/ML IV BOLUS
INTRAVENOUS | Status: DC | PRN
  Administered 2014-07-31: 100 mg via INTRAVENOUS
  Administered 2014-07-31: 200 mg via INTRAVENOUS

## 2014-07-31 MED ORDER — HYDROMORPHONE HCL 2 MG/ML IJ SOLN
INTRAMUSCULAR | Status: AC
  Filled 2014-07-31: qty 1

## 2014-07-31 MED ORDER — LACTATED RINGERS IV SOLN
INTRAVENOUS | Status: DC
  Administered 2014-07-31: 16:00:00 via INTRAVENOUS
  Administered 2014-07-31: 1000 mL via INTRAVENOUS

## 2014-07-31 SURGICAL SUPPLY — 30 items
BANDAGE ELASTIC 4 VELCRO ST LF (GAUZE/BANDAGES/DRESSINGS) ×3 IMPLANT
BANDAGE ELASTIC 6 VELCRO ST LF (GAUZE/BANDAGES/DRESSINGS) ×2 IMPLANT
BNDG GAUZE ELAST 4 BULKY (GAUZE/BANDAGES/DRESSINGS) ×3 IMPLANT
COVER SURGICAL LIGHT HANDLE (MISCELLANEOUS) ×3 IMPLANT
CUFF TOURN SGL QUICK 24 (TOURNIQUET CUFF) ×3
CUFF TRNQT CYL 24X4X40X1 (TOURNIQUET CUFF) IMPLANT
DRAPE U-SHAPE 47X51 STRL (DRAPES) ×2 IMPLANT
DRSG EMULSION OIL 3X3 NADH (GAUZE/BANDAGES/DRESSINGS) ×3 IMPLANT
DRSG PAD ABDOMINAL 8X10 ST (GAUZE/BANDAGES/DRESSINGS) ×3 IMPLANT
GAUZE SPONGE 4X4 12PLY STRL (GAUZE/BANDAGES/DRESSINGS) ×3 IMPLANT
GLOVE ECLIPSE 8.0 STRL XLNG CF (GLOVE) ×3 IMPLANT
GLOVE INDICATOR 8.0 STRL GRN (GLOVE) ×6 IMPLANT
GOWN STRL REUS W/TWL XL LVL3 (GOWN DISPOSABLE) ×3 IMPLANT
KIT BASIN OR (CUSTOM PROCEDURE TRAY) ×3 IMPLANT
MANIFOLD NEPTUNE II (INSTRUMENTS) ×3 IMPLANT
NEEDLE ANCHOR KEITH 2 7/8 STR (NEEDLE) ×2 IMPLANT
PACK ORTHO EXTREMITY (CUSTOM PROCEDURE TRAY) ×3 IMPLANT
PAD CAST 4YDX4 CTTN HI CHSV (CAST SUPPLIES) ×1 IMPLANT
PADDING CAST COTTON 4X4 STRL (CAST SUPPLIES) ×3
PADDING CAST COTTON 6X4 STRL (CAST SUPPLIES) ×4 IMPLANT
SPLINT FIBERGLASS 4X30 (CAST SUPPLIES) ×4 IMPLANT
SUT ETHIBOND 3-0 V-5 (SUTURE) ×2 IMPLANT
SUT ETHILON 4 0 (SUTURE) ×4 IMPLANT
SUT MERSILENE 4 0 P 3 (SUTURE) ×4 IMPLANT
SUT VIC AB 2-0 CT1 27 (SUTURE) ×3
SUT VIC AB 2-0 CT1 27XBRD (SUTURE) ×1 IMPLANT
SUT VIC AB 3-0 SH 27 (SUTURE) ×3
SUT VIC AB 3-0 SH 27XBRD (SUTURE) ×1 IMPLANT
SYRINGE CONTROL L 12CC (SYRINGE) ×3 IMPLANT
SYRINGE CONTROL LL 12CC (SYRINGE) IMPLANT

## 2014-07-31 NOTE — Anesthesia Postprocedure Evaluation (Signed)
  Anesthesia Post-op Note  Patient: Marcus [name]  Procedure(s) Performed: Procedure(s) (LRB): LEFT FOOT TENDON REPAIR (Left)  Patient Location: PACU  Anesthesia Type: General  Level of Consciousness: awake and alert   Airway and Oxygen Therapy: Patient Spontanous Breathing  Post-op Pain: mild  Post-op Assessment: Post-op Vital signs reviewed, Patient's Cardiovascular Status Stable, Respiratory Function Stable, Patent Airway and No signs of Nausea or vomiting  Last Vitals:  Filed Vitals:   07/31/14 1905  BP: 117/81  Pulse: 77  Temp: 36.9 C  Resp: 17    Post-op Vital Signs: stable   Complications: No apparent anesthesia complications

## 2014-07-31 NOTE — Brief Op Note (Signed)
07/31/2014  5:39 PM  PATIENT:  Marcus [name]  44 y.o. male  PRE-OPERATIVE DIAGNOSIS:  left foot tendon laceration  POST-OPERATIVE DIAGNOSIS:  left foot tendon laceration  PROCEDURE:  Procedure(s): LEFT FOOT TENDON REPAIR (Left)  SURGEON:  Surgeon(s) and Role:    * Ollen GrossFrank Seabron Iannello, MD - Primary  PHYSICIAN ASSISTANT:   ASSISTANTS: Avel Peacerew Perkins, PA-C   ANESTHESIA:   general  EBL:     BLOOD ADMINISTERED:none  DRAINS: none   LOCAL MEDICATIONS USED:  MARCAINE     COUNTS:  YES  TOURNIQUET:   Total Tourniquet Time Documented: Thigh (Left) - 34 minutes Total: Thigh (Left) - 34 minutes   DICTATION: .Other Dictation: Dictation Number [id number]  PLAN OF CARE: Discharge to home after PACU  PATIENT DISPOSITION:  PACU - hemodynamically stable.

## 2014-07-31 NOTE — Transfer of Care (Signed)
Immediate Anesthesia Transfer of Care Note  Patient: Marcus [name]  Procedure(s) Performed: Procedure(s): LEFT FOOT TENDON REPAIR (Left)  Patient Location: PACU  Anesthesia Type:General  Level of Consciousness: awake, sedated and patient cooperative  Airway & Oxygen Therapy: Patient Spontanous Breathing and Patient connected to face mask oxygen  Post-op Assessment: Report given to RN and Post -op Vital signs reviewed and stable  Post vital signs: Reviewed and stable  Last Vitals:  Filed Vitals:   07/31/14 1300  BP: 120/80  Pulse: 65  Temp: 36.9 C  Resp: 18    Complications: No apparent anesthesia complications

## 2014-07-31 NOTE — H&P (Signed)
Marcus [name] is an 44 y.o. male.   Chief Complaint: Left foot pain HPI: Marcus HarborLonnie is a 44 yo male who had a piece of broken glass lacerate the dorsum of his left foot 7 days agowith subsequent inability to extend his great toe. He was seen in the [facility] ED and felt to have an extensor tendon laceration. He presented to our office 4 days ago with a clean sutured laceration with inability to extend his great toe. Sensation was also diminished on the medial aspect of the great toe. He presents today for wound exploration and repair of his EHL tendon  Past Medical History  Diagnosis Date  . Chronic back pain   . Suicidal overdose     Past Surgical History  Procedure Laterality Date  . None      History reviewed. No pertinent family history. Social History:  reports that he has quit smoking. His smokeless tobacco use includes Chew. He reports that he drinks alcohol. He reports that he does not use illicit drugs.  Allergies:  Allergies  Allergen Reactions  . Sulfa Antibiotics Nausea And Vomiting  . Benadryl [Diphenhydramine Hcl]     " restless leg syndrome"  . Bupropion     Insomnia  . Flexeril  [Cyclobenzaprine Hcl]     Restless leg  . Robaxin  [Methocarbamol]     Restless leg syndrome  . Seroquel [Quetiapine Fumarate]     "Restless leg syndrome"  . Trazodone And Nefazodone     "restless leg syndrome"    Medications Prior to Admission  Medication Sig Dispense Refill  . oxyCODONE-acetaminophen (PERCOCET) 5-325 MG per tablet Take 2 tablets by mouth every 4 (four) hours as needed. 10 tablet 0  . gabapentin (NEURONTIN) 400 MG capsule Take 1 capsule (400 mg total) by mouth 3 (three) times daily. For agitation/chronic pain (Patient not taking: Reported on 07/31/2014) 21 capsule 0  . hydrOXYzine (ATARAX/VISTARIL) 50 MG tablet Take 1 tablet (50 mg total) by mouth every 8 (eight) hours as needed for anxiety. (Patient not taking: Reported on 07/31/2014) 21 tablet 0  . venlafaxine XR  (EFFEXOR-XR) 75 MG 24 hr capsule Take 1 capsule (75 mg total) by mouth daily with breakfast. For depression (Patient not taking: Reported on 07/31/2014) 7 capsule 0  . zolpidem (AMBIEN) 10 MG tablet Take 1 tablet (10 mg total) by mouth at bedtime as needed for sleep. (Patient not taking: Reported on 07/31/2014) 7 tablet 0    No results found for this or any previous visit (from the past 48 hour(s)). No results found.  Review of Systems  Constitutional: Negative.   HENT: Negative.   Eyes: Negative.   Respiratory: Negative.   Cardiovascular: Negative.   Gastrointestinal: Negative.   Genitourinary: Negative.   Musculoskeletal: Negative.   Skin: Negative.   Neurological: Negative.   Endo/Heme/Allergies: Negative.   Psychiatric/Behavioral: Negative.     Blood pressure 120/80, pulse 65, temperature 98.5 F (36.9 C), temperature source Oral, resp. rate 18, height 6' (1.829 m), weight 90.946 kg (200 lb 8 oz), SpO2 100 %. Physical Exam Physical Examination: General appearance - alert, well appearing, and in no distress Mental status - alert, oriented to person, place, and time Chest - clear to auscultation, no wheezes, rales or rhonchi, symmetric air entry Heart - normal rate, regular rhythm, normal S1, S2, no murmurs, rubs, clicks or gallops Abdomen - soft, nontender, nondistended, no masses or organomegaly Neurological - alert, oriented, normal speech, no focal findings or movement disorder noted Extremities -  peripheral pulses normal, no pedal edema, no clubbing or cyanosis Left foot transverse laceration well healed. Unable to extend great toe Sensation diminished medial aspect great toe   Assessment/Plan Left foot laceration with EHL laceration- Plan wound exploration and EHL repair. Discussed in detail with patient who elects to proceed.  Loanne Drilling 07/31/2014, 4:06 PM

## 2014-07-31 NOTE — Anesthesia Preprocedure Evaluation (Signed)
Anesthesia Evaluation  Patient identified by MRN, date of birth, ID band Patient awake    Reviewed: Allergy & Precautions, NPO status , Patient's Chart, lab work & pertinent test results  Airway Mallampati: II  TM Distance: >3 FB Neck ROM: Full    Dental no notable dental hx.    Pulmonary former smoker,  breath sounds clear to auscultation  Pulmonary exam normal       Cardiovascular negative cardio ROS Normal cardiovascular examRhythm:Regular Rate:Normal     Neuro/Psych PSYCHIATRIC DISORDERS Depression negative neurological ROS     GI/Hepatic negative GI ROS, Neg liver ROS,   Endo/Other  negative endocrine ROS  Renal/GU negative Renal ROS  negative genitourinary   Musculoskeletal negative musculoskeletal ROS (+)   Abdominal   Peds negative pediatric ROS (+)  Hematology negative hematology ROS (+)   Anesthesia Other Findings   Reproductive/Obstetrics negative OB ROS                             Anesthesia Physical Anesthesia Plan  ASA: II  Anesthesia Plan: General   Post-op Pain Management:    Induction: Intravenous  Airway Management Planned: LMA  Additional Equipment:   Intra-op Plan:   Post-operative Plan: Extubation in OR  Informed Consent: I have reviewed the patients History and Physical, chart, labs and discussed the procedure including the risks, benefits and alternatives for the proposed anesthesia with the patient or authorized representative who has indicated his/her understanding and acceptance.   Dental advisory given  Plan Discussed with: CRNA  Anesthesia Plan Comments:         Anesthesia Quick Evaluation

## 2014-07-31 NOTE — Anesthesia Procedure Notes (Signed)
Procedure Name: LMA Insertion Date/Time: 07/31/2014 4:28 PM Performed by: Paris LoreBLANTON, Michoel Kunin M Pre-anesthesia Checklist: Patient identified, Emergency Drugs available, Suction available, Patient being monitored and Timeout performed Patient Re-evaluated:Patient Re-evaluated prior to inductionOxygen Delivery Method: Circle system utilized Preoxygenation: Pre-oxygenation with 100% oxygen Intubation Type: IV induction Ventilation: Mask ventilation without difficulty LMA: LMA inserted LMA Size: 4.0 Number of attempts: 1 Placement Confirmation: positive ETCO2 and breath sounds checked- equal and bilateral Tube secured with: Tape

## 2014-07-31 NOTE — Discharge Instructions (Signed)
Do not bear any weight on your left foot  Keep your left leg elevated above the level of your heart while resting  Keep your bandage clean and dry  Take a 325 mg Aspirin daily for the next 4 weeks

## 2014-08-01 ENCOUNTER — Encounter (HOSPITAL_COMMUNITY): Payer: Self-pay | Admitting: Orthopedic Surgery

## 2014-08-01 NOTE — Op Note (Signed)
NAME:  Marcus [name], Marcus [name] NO.:  [id number]  MEDICAL RECORD NO.:  [id number]  LOCATION:  [facility]                         FACILITY:  [facility]  PHYSICIAN:  Ollen Gross, M.D.    DATE OF BIRTH:  1970-09-03  DATE OF PROCEDURE:  07/31/2014 DATE OF DISCHARGE:  07/31/2014                              OPERATIVE REPORT   PREOPERATIVE DIAGNOSIS:  Left foot tendon laceration of extensor hallucis longus.  POSTOPERATIVE DIAGNOSIS:  Left foot tendon laceration of extensor hallucis longus.  PROCEDURE:  Left foot tendon repair of extensor hallucis longus.  SURGEON:  Ollen Gross, M.D.  ASSISTANT:  Alexzandrew L. Perkins, P.A.C.  ANESTHESIA:  General.  ESTIMATED BLOOD LOSS:  Minimal.  DRAINS:  None.  TOURNIQUET TIME:  33 minutes at 300 mmHg.  COMPLICATIONS:  None.  CONDITION:  Stable to recovery.  BRIEF CLINICAL NOTE:  Marcus [name] is a 44 year old male who had injured his left foot a week ago.  A piece of glass from a broken vase got lodged in his foot with a transverse laceration on the medial aspect of the dorsum of the left foot.  He was unable to extend his great toe.  We saw him in the office 3 days later and noted that he had an EHL laceration.  The wound was healing well.  He presents today for exploration of the wound and EHL repair.  PROCEDURE IN DETAIL:  After successful administration of general anesthetic, tourniquet was placed on the left thigh.  Left lower extremity was prepped and draped in usual sterile fashion.  Extremity was wrapped in an Esmarch, tourniquet inflated to 300 mmHg.  We opened up the oblique transverse laceration on the dorsum of his foot.  I was able to find the distal part of the EHL tendon attached to the great toe, but proximally, it had retracted.  I extended the proximal part of the incision along the medial border of the metatarsal about another inch or two.  I was then able to find the edges of the tendon.  I was able to easily  mobilize the tendon.  We cleaned the edges of the tendon up to more healthy appearing tissue.  We then placed a 3-0 Ethibond suture and distal part of the tendon in a banal-type weave to exit through where the laceration was.  Similarly another 3-0 Ethibond was used on the proximal part of the tendon.  We pulled the sutures through the tendon, tied them together and then with an additional suture, I over sewed the medial and lateral borders of the tendon in a weave. Felt the repair was very sturdy.  The toe was migrating into extension easily.  I was able to get it back to neutral without any excess tension on the repair.  We then copiously irrigated with saline solution.  I closed the sheath with the Ethibond suture also.  The tourniquet then released total time of 34 minutes.  The skin was closed with interrupted 4-0 nylon.  Incision was cleaned and dried and a bulky sterile dressing applied.  He was then placed into a posterior splint and the toe was held in extension.  When the splint hardened, he subsequently awakened and transported to recovery  in stable condition.  Note that the surgical assistant is a medical necessity for this procedure for proper retraction to allow for repair of the tendon and also for holding the toe in the appropriate position so as to not place any undue tension on the repair.     Ollen GrossFrank Danyal Whitenack, M.D.     FA/MEDQ  D:  07/31/2014  T:  08/01/2014  Job:  [id number]

## 2015-11-21 ENCOUNTER — Encounter (HOSPITAL_COMMUNITY): Payer: Self-pay | Admitting: *Deleted

## 2015-11-21 ENCOUNTER — Inpatient Hospital Stay (HOSPITAL_COMMUNITY)
Admission: AD | Admit: 2015-11-21 | Discharge: 2015-11-23 | DRG: 885 | Disposition: A | Discharge status: Home / Self Care | Payer: Medicaid Other | Attending: Internal Medicine | Admitting: Internal Medicine

## 2015-11-21 DIAGNOSIS — L03114 Cellulitis of left upper limb: Secondary | ICD-10-CM

## 2015-11-21 DIAGNOSIS — L02512 Cutaneous abscess of left hand: Secondary | ICD-10-CM | POA: Diagnosis not present

## 2015-11-21 DIAGNOSIS — Z888 Allergy status to other drugs, medicaments and biological substances status: Secondary | ICD-10-CM

## 2015-11-21 DIAGNOSIS — Z87891 Personal history of nicotine dependence: Secondary | ICD-10-CM | POA: Diagnosis not present

## 2015-11-21 DIAGNOSIS — Z882 Allergy status to sulfonamides status: Secondary | ICD-10-CM | POA: Diagnosis not present

## 2015-11-21 DIAGNOSIS — L02413 Cutaneous abscess of right upper limb: Secondary | ICD-10-CM

## 2015-11-21 DIAGNOSIS — Z79891 Long term (current) use of opiate analgesic: Secondary | ICD-10-CM | POA: Diagnosis not present

## 2015-11-21 DIAGNOSIS — F332 Major depressive disorder, recurrent severe without psychotic features: Principal | ICD-10-CM | POA: Diagnosis present

## 2015-11-21 DIAGNOSIS — R569 Unspecified convulsions: Secondary | ICD-10-CM

## 2015-11-21 DIAGNOSIS — L03113 Cellulitis of right upper limb: Secondary | ICD-10-CM | POA: Diagnosis not present

## 2015-11-21 DIAGNOSIS — F99 Mental disorder, not otherwise specified: Secondary | ICD-10-CM

## 2015-11-21 DIAGNOSIS — F17220 Nicotine dependence, chewing tobacco, uncomplicated: Secondary | ICD-10-CM | POA: Diagnosis present

## 2015-11-21 DIAGNOSIS — L02414 Cutaneous abscess of left upper limb: Secondary | ICD-10-CM | POA: Diagnosis not present

## 2015-11-21 DIAGNOSIS — Z79899 Other long term (current) drug therapy: Secondary | ICD-10-CM | POA: Diagnosis not present

## 2015-11-21 DIAGNOSIS — F419 Anxiety disorder, unspecified: Secondary | ICD-10-CM | POA: Diagnosis present

## 2015-11-21 DIAGNOSIS — F322 Major depressive disorder, single episode, severe without psychotic features: Secondary | ICD-10-CM | POA: Diagnosis not present

## 2015-11-21 DIAGNOSIS — R45851 Suicidal ideations: Secondary | ICD-10-CM | POA: Diagnosis not present

## 2015-11-21 MED ORDER — GABAPENTIN 600 MG PO TABS: 300.0000 mg | ORAL_TABLET | Freq: Three times a day (TID) | ORAL | Status: DC

## 2015-11-21 MED ORDER — ACETAMINOPHEN 325 MG PO TABS
650.0000 mg | ORAL_TABLET | Freq: Four times a day (QID) | ORAL | Status: DC | PRN
  Administered 2015-11-23: 650 mg via ORAL
  Filled 2015-11-21: qty 2

## 2015-11-21 MED ORDER — ALUM & MAG HYDROXIDE-SIMETH 200-200-20 MG/5ML PO SUSP: 30.0000 mL | ORAL | Status: DC | PRN

## 2015-11-21 MED ORDER — DOXEPIN HCL 10 MG PO CAPS
10.0000 mg | ORAL_CAPSULE | Freq: Every evening | ORAL | Status: DC | PRN
  Filled 2015-11-21 (×7): qty 1

## 2015-11-21 MED ORDER — MAGNESIUM HYDROXIDE 400 MG/5ML PO SUSP: 30.0000 mL | Freq: Every day | ORAL | Status: DC | PRN

## 2015-11-21 MED ORDER — DULOXETINE HCL 20 MG PO CPEP
20.0000 mg | ORAL_CAPSULE | Freq: Two times a day (BID) | ORAL | Status: DC
Start: 2015-11-21 — End: 2015-11-22
  Filled 2015-11-21 (×7): qty 1

## 2015-11-21 MED ORDER — GABAPENTIN 100 MG PO CAPS
200.0000 mg | ORAL_CAPSULE | Freq: Three times a day (TID) | ORAL | Status: DC
  Administered 2015-11-22 (×2): 200 mg via ORAL
  Filled 2015-11-21 (×8): qty 2

## 2015-11-21 MED ORDER — OXYCODONE-ACETAMINOPHEN 5-325 MG PO TABS
1.0000 | ORAL_TABLET | Freq: Four times a day (QID) | ORAL | Status: DC | PRN
Start: 2015-11-21 — End: 2015-11-23
  Administered 2015-11-22 – 2015-11-23 (×6): 1 via ORAL
  Filled 2015-11-21 (×6): qty 1
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
## 2015-11-21 NOTE — Progress Notes (Signed)
D: Pt. is up and visible in his room, sleeping in bed. Denies having any SI/HI/AVH/Pain at this time. Pt. appears drowsy and irritable at this time. Interaction was minimal and brief. Will cont. to monitor throughout the night. As of now, respirations equal and unlabored.   A: Encouragement and support given.   R: Safety maintained with Q 15 checks. Continues to follow treatment plan and will monitor closely. No questions/concerns at this time.
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
## 2015-11-21 NOTE — Tx Team (Signed)
Initial Treatment Plan 11/21/2015 6:08 PM Marcus [name] AVW:[id number]    PATIENT STRESSORS: Financial difficulties Health problems Marital or family conflict Occupational concerns   PATIENT STRENGTHS: Capable of independent living Wellsite geologistCommunication skills General fund of knowledge   PATIENT IDENTIFIED PROBLEMS: Anxiety  Depression  "Getting out"  "I don't think I was done right. I don't belong here"               DISCHARGE CRITERIA:  Ability to meet basic life and health needs Improved stabilization in mood, thinking, and/or behavior Motivation to continue treatment in a less acute level of care Need for constant or close observation no longer present Verbal commitment to aftercare and medication compliance  PRELIMINARY DISCHARGE PLAN: Outpatient therapy Participate in family therapy Return to previous living arrangement  PATIENT/FAMILY INVOLVEMENT: This treatment plan has been presented to and reviewed with the patient, Marcus [name].  The patient and family have been given the opportunity to ask questions and make suggestions.  Carleene OverlieMiddleton, Tiago Humphrey P, RN 11/21/2015, 6:08 PM

## 2015-11-21 NOTE — Plan of Care (Signed)
Problem: Safety: Goal: Periods of time without injury will increase Outcome: Progressing Pt. on seizure precautions, remains a low fall risk, denies SI/HI at this time, Q 15 checks in place for safety.

## 2015-11-22 DIAGNOSIS — L02414 Cutaneous abscess of left upper limb: Secondary | ICD-10-CM | POA: Insufficient documentation

## 2015-11-22 DIAGNOSIS — Z888 Allergy status to other drugs, medicaments and biological substances status: Secondary | ICD-10-CM

## 2015-11-22 DIAGNOSIS — Z79899 Other long term (current) drug therapy: Secondary | ICD-10-CM

## 2015-11-22 DIAGNOSIS — L03114 Cellulitis of left upper limb: Secondary | ICD-10-CM

## 2015-11-22 LAB — HEPATIC FUNCTION PANEL
ALBUMIN: 3.9 g/dL (ref 3.5–5.0)
ALK PHOS: 127 U/L — AB (ref 38–126)
ALT: 68 U/L — AB (ref 17–63)
AST: 41 U/L (ref 15–41)
BILIRUBIN TOTAL: 0.6 mg/dL (ref 0.3–1.2)
Bilirubin, Direct: 0.1 mg/dL (ref 0.1–0.5)
Indirect Bilirubin: 0.5 mg/dL (ref 0.3–0.9)
Total Protein: 7.9 g/dL (ref 6.5–8.1)

## 2015-11-22 LAB — COMPREHENSIVE METABOLIC PANEL
ALK PHOS: 129 U/L — AB (ref 38–126)
ALT: 67 U/L — ABNORMAL HIGH (ref 17–63)
AST: 41 U/L (ref 15–41)
Albumin: 3.9 g/dL (ref 3.5–5.0)
Anion gap: 8 (ref 5–15)
BILIRUBIN TOTAL: 0.4 mg/dL (ref 0.3–1.2)
BUN: 8 mg/dL (ref 6–20)
CALCIUM: 9.1 mg/dL (ref 8.9–10.3)
CO2: 29 mmol/L (ref 22–32)
CREATININE: 0.89 mg/dL (ref 0.61–1.24)
Chloride: 102 mmol/L (ref 101–111)
GFR calc Af Amer: >60 mL/min (ref >60)
Glucose, Bld: 87 mg/dL (ref 65–99)
POTASSIUM: 4.2 mmol/L (ref 3.5–5.1)
Sodium: 139 mmol/L (ref 135–145)
TOTAL PROTEIN: 7.7 g/dL (ref 6.5–8.1)

## 2015-11-22 LAB — CBC
HEMATOCRIT: 45.6 % (ref 39.0–52.0)
Hemoglobin: 15.4 g/dL (ref 13.0–17.0)
MCH: 30.1 pg (ref 26.0–34.0)
MCHC: 33.8 g/dL (ref 30.0–36.0)
MCV: 89.1 fL (ref 78.0–100.0)
Platelets: 299 10*3/uL (ref 150–400)
RBC: 5.12 MIL/uL (ref 4.22–5.81)
RDW: 13 % (ref 11.5–15.5)
WBC: 8.7 10*3/uL (ref 4.0–10.5)

## 2015-11-22 LAB — TSH: TSH: 2.222 u[IU]/mL (ref 0.350–4.500)

## 2015-11-22 LAB — LIPID PANEL
CHOL/HDL RATIO: 5.5 ratio
CHOLESTEROL: 176 mg/dL (ref 0–200)
HDL: 32 mg/dL — ABNORMAL LOW (ref >40)
LDL Cholesterol: 106 mg/dL — ABNORMAL HIGH (ref 0–99)
Triglycerides: 189 mg/dL — ABNORMAL HIGH (ref <150)
VLDL: 38 mg/dL (ref 0–40)

## 2015-11-22 MED ORDER — GABAPENTIN 400 MG PO CAPS
400.0000 mg | ORAL_CAPSULE | Freq: Two times a day (BID) | ORAL | Status: DC
  Administered 2015-11-22 – 2015-11-23 (×2): 400 mg via ORAL
  Filled 2015-11-22 (×6): qty 1

## 2015-11-22 MED ORDER — CLONAZEPAM 0.5 MG PO TABS
1.0000 mg | ORAL_TABLET | Freq: Two times a day (BID) | ORAL | Status: DC
  Administered 2015-11-22 – 2015-11-23 (×2): 1 mg via ORAL
  Filled 2015-11-22 (×2): qty 1

## 2015-11-22 MED ORDER — VENLAFAXINE HCL ER 150 MG PO CP24
150.0000 mg | ORAL_CAPSULE | Freq: Every day | ORAL | Status: DC
  Administered 2015-11-23: 150 mg via ORAL
  Filled 2015-11-22 (×3): qty 1

## 2015-11-22 MED ORDER — AMPHETAMINE-DEXTROAMPHET ER 5 MG PO CP24
20.0000 mg | ORAL_CAPSULE | Freq: Every day | ORAL | Status: DC
  Administered 2015-11-23: 20 mg via ORAL
  Filled 2015-11-22 (×2): qty 2

## 2015-11-22 MED ORDER — DOXYCYCLINE HYCLATE 100 MG PO TABS
100.0000 mg | ORAL_TABLET | Freq: Two times a day (BID) | ORAL | Status: DC
  Administered 2015-11-22 – 2015-11-23 (×2): 100 mg via ORAL
  Filled 2015-11-22 (×7): qty 1

## 2015-11-22 MED ORDER — TIZANIDINE HCL 4 MG PO TABS
2.0000 mg | ORAL_TABLET | Freq: Every day | ORAL | Status: DC
  Administered 2015-11-22: 2 mg via ORAL
  Filled 2015-11-22 (×3): qty 1

## 2015-11-22 NOTE — Plan of Care (Signed)
Problem: Coping: Goal: Ability to verbalize feelings will improve Outcome: Not Progressing Patient has remained in bed. Minimal information forwarded. Not attending unit programming activities.  Problem: Safety: Goal: Ability to disclose and discuss suicidal ideas will improve Outcome: Progressing Patient denying SI, no self injury thoughts.
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
## 2015-11-22 NOTE — Plan of Care (Deleted)
Problem: Activity: Goal: Imbalance in normal sleep/wake cycle will improve Outcome: Progressing Pt. had 6.75 hrs of sleep last night.

## 2015-11-22 NOTE — [facility] Group Notes (Signed)
[facility] Group Therapy 11/22/2015 1:15pm  Type of Therapy: [facility] [employer] Presentation  Participation Level: Active  Participation Quality: Attentive  Affect: Appropriate  Cognitive: Oriented  Insight: Developing/Improving  Engagement in Therapy: Engaged  Modes of Intervention: Discussion, Education and Socialization  Summary of Progress/Problems: Mental [employer] Association (MHA) Speaker came to talk about his personal journey with substance abuse and addiction. The pt processed ways by which to relate to the speaker. MHA speaker provided handouts and educational information pertaining to groups and services offered by the MHA. Pt was engaged in speaker's presentation and was receptive to resources provided.    Krzysztof Reichelt Carter, LCSWA 11/22/2015 1:39 PM  
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
## 2015-11-22 NOTE — Progress Notes (Addendum)
D: Pt. is up and visible in his room, sleeping in bed. Respirations equal and unlabored. Denies having any SI/HI/AVH at this time. Pt. presents with a malaise and depressed affect and mood. Pain is 5/10 in bilateral upper extremities. Pt. states "I feel tired all the time ever since this (abcess) happen 2 days ago". Writer accessed Pt. upper extremities, R. Mid forearm and the back of L. hand is both swollen, reddened, warm and tender to touch. Writer skin mark reddened/swollen area on Pt. to monitor for enlargement. Pt. edcuated to let writer know of any new changes.   A: Encouragement and support given. Writer educated Pt. about importance of proper hand hygiene to reduce infection and to avoid rubbing/scratching. Antibiotics and additonal Meds. ordered and given. PRN Percocet requested and given. Will re-eval as necessary.   R: Safety maintained with Q 15 checks. Continues to follow treatment plan and will monitor closely. No questions/concerns at this time.

## 2015-11-22 NOTE — [facility] Suicide Risk Assessment (Signed)
[facility]   Nursing information obtained from:  Patient Demographic factors:  Male, Caucasian, Unemployed Current Mental Status:  Suicidal ideation indicated by others Loss Factors:  Decrease in vocational status, Loss of significant relationship, Decline in physical health, Financial problems / change in socioeconomic status Historical Factors:  Prior suicide attempts Risk Reduction Factors:  Responsible for children under 45 years of age, Living with another person, especially a relative  Total Time spent with patient: 1 hour Principal Problem: <principal problem not specified> Diagnosis:   Patient Active Problem List   Diagnosis Date Noted  . MDD (major depressive disorder), recurrent episode, severe (HCC) [F33.2] 11/21/2015  . Foot laceration involving tendon [Z61.096E[S91.309A, A54.098J]S96.929A] 07/31/2014  . Benzodiazepine dependence (HCC) [F13.20] 03/23/2014  . Alcohol intoxication (HCC) [F10.129]   . Overdose [T50.901A]   . Major depressive disorder, recurrent, severe without psychotic features (HCC) [F33.2]   . Opioid dependence with opioid-induced mood disorder (HCC) [F11.24]   . MDD (major depressive disorder) (HCC) [F32.9] 01/11/2014  . Opiate abuse, continuous [F11.10] 05/20/2012    Class: Acute  . Benzodiazepine abuse, continuous [F13.10] 05/20/2012    Class: Acute   Subjective Data: see admission assessment  Continued Clinical Symptoms:  Alcohol Use Disorder Identification Test Final Score (AUDIT): 0 The "Alcohol Use Disorders Identification Test", Guidelines for Use in Primary Care, Second Edition.  World Science writerHealth Organization [facility](WHO). Score between 0-7:  no or low risk or alcohol related problems. Score between 8-15:  moderate risk of alcohol related problems. Score between 16-19:  high risk of alcohol related problems. Score 20 or above:  warrants further diagnostic evaluation for alcohol dependence and treatment.   CLINICAL FACTORS:   Depression:    Comorbid alcohol abuse/dependence Alcohol/Substance Abuse/Dependencies   Musculoskeletal:see admission assessment  Psychiatric Specialty Exam:see admission assessment Physical Exam  ROS  Blood pressure 116/79, pulse 85, temperature 97.8 F (36.6 C), temperature source Oral, resp. rate 16, height 6' (1.829 m), weight 90.3 kg (199 lb), SpO2 100 %.Body mass index is 26.99 kg/m.  Assets:  Communication Skills Desire for Improvement Housing  ADL's:  Intact  Cognition:  WNL  Sleep:  Number of Hours: 6.75      COGNITIVE FEATURES THAT CONTRIBUTE TO RISK:  None    SUICIDE RISK:   Moderate:  Frequent suicidal ideation with limited intensity, and duration, some specificity in terms of plans, no associated intent, good self-control, limited dysphoria/symptomatology, some risk factors present, and identifiable protective factors, including available and accessible social support.   PLAN OF CARE: see admission assessment  I certify that inpatient services furnished can reasonably be expected to improve the patient's condition.  Wynelle BourgeoisUreh N Lekauwa, MD 11/22/2015, 12:37 PM

## 2015-11-22 NOTE — Plan of Care (Signed)
Problem: Activity: Goal: Sleeping patterns will improve Outcome: Progressing Pt. had 6.75 hrs. of sleep last night.    

## 2015-11-22 NOTE — [facility] Suicide Risk Assessment (Signed)
[facility] INPATIENT:  Family/Significant Other Suicide Prevention Education  Suicide Prevention Education:  Patient Refusal for Family/Significant Other Suicide Prevention Education: The patient Marcus [name] has refused to provide written consent for family/significant other to be provided Family/Significant Other Suicide Prevention Education during admission and/or prior to discharge.  Physician notified.  Elaina HoopsLauren M Carter 11/22/2015, 1:48 PM

## 2015-11-22 NOTE — Tx Team (Signed)
Interdisciplinary Treatment and Diagnostic Plan Update  11/22/2015 Time of Session: 1:44 PM  Marcus [name] MRN: [id number]  Principal Diagnosis: MDD SEVERE   Secondary Diagnoses: Active Problems:   MDD (major depressive disorder), recurrent episode, severe (HCC)   Current Medications:  Current Facility-Administered Medications  Medication Dose Route Frequency Provider Last Rate Last Dose  . acetaminophen (TYLENOL) tablet 650 mg  650 mg Oral Q6H PRN Laverle Hobby, PA-C      . alum & mag hydroxide-simeth (MAALOX/MYLANTA) 200-200-20 MG/5ML suspension 30 mL  30 mL Oral Q4H PRN Laverle Hobby, PA-C      . amphetamine-dextroamphetamine (ADDERALL XR) 24 hr capsule 20 mg  20 mg Oral Daily Sueanne Margarita, MD      . clonazePAM Bobbye Charleston) tablet 1 mg  1 mg Oral BID Sueanne Margarita, MD      . doxepin (SINEQUAN) capsule 10 mg  10 mg Oral QHS,MR X 1 Spencer E Simon, PA-C      . gabapentin (NEURONTIN) capsule 400 mg  400 mg Oral BID Sueanne Margarita, MD      . magnesium hydroxide (MILK OF MAGNESIA) suspension 30 mL  30 mL Oral Daily PRN Laverle Hobby, PA-C      . oxyCODONE-acetaminophen (PERCOCET/ROXICET) 5-325 MG per tablet 1-2 tablet  1-2 tablet Oral Q6H PRN Laverle Hobby, PA-C   1 tablet at 11/22/15 [phone]  . tiZANidine (ZANAFLEX) tablet 2 mg  2 mg Oral QHS Sueanne Margarita, MD      . Derrill Memo ON 11/23/2015] venlafaxine XR (EFFEXOR-XR) 24 hr capsule 150 mg  150 mg Oral Q breakfast Sueanne Margarita, MD        PTA Medications: Prescriptions Prior to Admission  Medication Sig Dispense Refill Last Dose  . hydrOXYzine (ATARAX/VISTARIL) 50 MG tablet Take 1 tablet (50 mg total) by mouth every 8 (eight) hours as needed for anxiety. (Patient not taking: Reported on 07/31/2014) 21 tablet 0 Not Taking at Unknown time  . oxyCODONE-acetaminophen (PERCOCET) 5-325 MG per tablet Take 1-2 tablets by mouth every 6 (six) hours as needed for moderate pain or severe pain. 40 tablet 0     Treatment Modalities:  Medication Management, Group therapy, Case management,  1 to 1 session with clinician, Psychoeducation, Recreational therapy.   Physician Treatment Plan for Primary Diagnosis: MDD SEVERE  Long Term Goal(s): Improvement in symptoms so as ready for discharge  Short Term Goals: Ability to identify changes in lifestyle to reduce recurrence of condition will improve, Ability to verbalize feelings will improve, Ability to disclose and discuss suicidal ideas, Ability to demonstrate self-control will improve, Ability to identify and develop effective coping behaviors will improve, Ability to maintain clinical measurements within normal limits will improve, Compliance with prescribed medications will improve and Ability to identify triggers associated with substance abuse/mental health issues will improve  Medication Management: Evaluate patient's response, side effects, and tolerance of medication regimen.  Therapeutic Interventions: 1 to 1 sessions, Unit Group sessions and Medication administration.  Evaluation of Outcomes: Not Met  Physician Treatment Plan for Secondary Diagnosis: Active Problems:   MDD (major depressive disorder), recurrent episode, severe ([place])   Long Term Goal(s): Improvement in symptoms so as ready for discharge  Short Term Goals: Ability to identify changes in lifestyle to reduce recurrence of condition will improve, Ability to verbalize feelings will improve, Ability to disclose and discuss suicidal ideas, Ability to demonstrate self-control will improve, Ability to identify and develop effective coping behaviors will improve, Ability  to maintain clinical measurements within normal limits will improve, Compliance with prescribed medications will improve and Ability to identify triggers associated with substance abuse/mental health issues will improve  Medication Management: Evaluate patient's response, side effects, and tolerance of medication regimen.  Therapeutic  Interventions: 1 to 1 sessions, Unit Group sessions and Medication administration.  Evaluation of Outcomes: Not Met   RN Treatment Plan for Primary Diagnosis: MDD SEVERE  Long Term Goal(s): Knowledge of disease and therapeutic regimen to maintain health will improve  Short Term Goals: Ability to verbalize feelings will improve, Ability to disclose and discuss suicidal ideas and Ability to identify and develop effective coping behaviors will improve  Medication Management: RN will administer medications as ordered by provider, will assess and evaluate patient's response and provide education to patient for prescribed medication. RN will report any adverse and/or side effects to prescribing provider.  Therapeutic Interventions: 1 on 1 counseling sessions, Psychoeducation, Medication administration, Evaluate responses to treatment, Monitor vital signs and CBGs as ordered, Perform/monitor CIWA, COWS, AIMS and Fall Risk screenings as ordered, Perform wound care treatments as ordered.  Evaluation of Outcomes: Not Met   LCSW Treatment Plan for Primary Diagnosis: MDD SEVERE  Long Term Goal(s): Safe transition to appropriate next level of care at discharge, Engage patient in therapeutic group addressing interpersonal concerns.  Short Term Goals: Engage patient in aftercare planning with referrals and resources, Increase emotional regulation, Identify triggers associated with mental health/substance abuse issues and Increase skills for wellness and recovery  Therapeutic Interventions: Assess for all discharge needs, 1 to 1 time with Social worker, Explore available resources and support systems, Assess for adequacy in community support network, Educate family and significant other(s) on suicide prevention, Complete Psychosocial Assessment, Interpersonal group therapy.  Evaluation of Outcomes: Not Met   Progress in Treatment: Attending groups: Pt is new to milieu, continuing to assess   Participating in groups: Pt is new to milieu, continuing to assess  Taking medication as prescribed: Yes, MD continues to assess for medication changes as needed Toleration medication: Yes, no side effects reported at this time Family/Significant other contact made: No, Pt declines Patient understands diagnosis: Continuing to assess Discussing patient identified problems/goals with staff: Yes Medical problems stabilized or resolved: Yes Denies suicidal/homicidal ideation: Yes Issues/concerns per patient self-inventory: None Other: N/A  New problem(s) identified: None identified at this time.   New Short Term/Long Term Goal(s): None identified at this time.   Discharge Plan or Barriers: Pt will return home and follow-up with outpatient services.   Reason for Continuation of Hospitalization: Anxiety Depression Medication stabilization Suicidal ideation  Estimated Length of Stay: 2-3 days  Attendees: Patient: 11/22/2015  1:44 PM  Physician: Dr. Ananias Pilgrim 11/22/2015  1:44 PM  Nursing: Loletta Specter, Darrol Angel 11/22/2015  1:44 PM  RN Care Manager: Lars Pinks, RN 11/22/2015  1:44 PM  Social Worker: Peri Maris, LCSW; Kristin Drinkard, LCSW 11/22/2015  1:44 PM  Recreational Therapist:  11/22/2015  1:44 PM  Other: Lindell Spar, NP; Samuel Jester, NP 11/22/2015  1:44 PM  Other:  11/22/2015  1:44 PM  Other: 11/22/2015  1:44 PM    Scribe for Treatment Team: Bo Mcclintock, LCSW 11/22/2015 1:44 PM

## 2015-11-22 NOTE — [facility] Group Notes (Signed)
[facility] Group Notes:  (Nursing/MHT/Case Management/Adjunct)  Date:  11/22/2015  Time:  0900  Type of Therapy:  Nurse Education  Participation Level:  Did Not Attend  Participation Quality:    Affect:    Cognitive:    Insight:    Engagement in Group:    Modes of Intervention:    Summary of Progress/Problems: Patient was invited to group however elected not to attend.  Merian CapronFriedman, Amaurie Wandel [facility] 11/22/2015, 0930
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
## 2015-11-23 ENCOUNTER — Inpatient Hospital Stay (HOSPITAL_COMMUNITY)
Admission: AD | Admit: 2015-11-23 | Discharge: 2015-11-28 | DRG: 580 | Disposition: A | Discharge status: Home / Self Care | Payer: Medicaid Other | Source: Other Acute Inpatient Hospital | Attending: Internal Medicine | Admitting: Internal Medicine

## 2015-11-23 DIAGNOSIS — R569 Unspecified convulsions: Secondary | ICD-10-CM

## 2015-11-23 DIAGNOSIS — G8929 Other chronic pain: Secondary | ICD-10-CM | POA: Diagnosis present

## 2015-11-23 DIAGNOSIS — R740 Nonspecific elevation of levels of transaminase and lactic acid dehydrogenase [LDH]: Secondary | ICD-10-CM | POA: Diagnosis present

## 2015-11-23 DIAGNOSIS — I739 Peripheral vascular disease, unspecified: Secondary | ICD-10-CM | POA: Diagnosis present

## 2015-11-23 DIAGNOSIS — L02413 Cutaneous abscess of right upper limb: Secondary | ICD-10-CM

## 2015-11-23 DIAGNOSIS — Z915 Personal history of self-harm: Secondary | ICD-10-CM

## 2015-11-23 DIAGNOSIS — L03114 Cellulitis of left upper limb: Secondary | ICD-10-CM | POA: Diagnosis present

## 2015-11-23 DIAGNOSIS — F329 Major depressive disorder, single episode, unspecified: Secondary | ICD-10-CM | POA: Diagnosis present

## 2015-11-23 DIAGNOSIS — M545 Low back pain: Secondary | ICD-10-CM | POA: Diagnosis present

## 2015-11-23 DIAGNOSIS — L02414 Cutaneous abscess of left upper limb: Secondary | ICD-10-CM | POA: Diagnosis present

## 2015-11-23 DIAGNOSIS — L03113 Cellulitis of right upper limb: Secondary | ICD-10-CM

## 2015-11-23 DIAGNOSIS — L02512 Cutaneous abscess of left hand: Secondary | ICD-10-CM | POA: Diagnosis present

## 2015-11-23 DIAGNOSIS — F332 Major depressive disorder, recurrent severe without psychotic features: Principal | ICD-10-CM

## 2015-11-23 DIAGNOSIS — Z720 Tobacco use: Secondary | ICD-10-CM

## 2015-11-23 LAB — HEMOGLOBIN A1C
HEMOGLOBIN A1C: 5.1 % (ref 4.8–5.6)
MEAN PLASMA GLUCOSE: 100 mg/dL

## 2015-11-23 LAB — PROLACTIN: Prolactin: 16.9 ng/mL — ABNORMAL HIGH (ref 4.0–15.2)

## 2015-11-23 MED ORDER — LIDOCAINE HCL (PF) 1 % IJ SOLN
30.0000 mL | Freq: Once | INTRAMUSCULAR | Status: AC
  Administered 2015-11-23: 30 mL
  Filled 2015-11-23: qty 30

## 2015-11-23 MED ORDER — OXYCODONE-ACETAMINOPHEN 5-325 MG PO TABS
1.0000 | ORAL_TABLET | Freq: Once | ORAL | Status: AC
  Administered 2015-11-23: 1 via ORAL
  Filled 2015-11-23: qty 1

## 2015-11-23 MED ORDER — NICOTINE POLACRILEX 2 MG MT GUM
2.0000 mg | CHEWING_GUM | OROMUCOSAL | Status: DC | PRN
  Administered 2015-11-23: 2 mg via ORAL
  Filled 2015-11-23: qty 1

## 2015-11-23 MED ORDER — CLINDAMYCIN PHOSPHATE 600 MG/50ML IV SOLN
600.0000 mg | Freq: Once | INTRAVENOUS | Status: AC
  Administered 2015-11-23: 600 mg via INTRAVENOUS
  Filled 2015-11-23: qty 50

## 2015-11-23 NOTE — Progress Notes (Signed)
Assessed Pt. skin this a.m., has not increase in size outside of skin marking. Pt. rate pain as 8/10, gradual increase in pain over night. Arm is still warm, red, swollen, and tender. Pt. was given Percocet PRN. Pt. is restless, lethargic, and range of motion to arm is limited. Will pass on to day nurse in report. Pt. needs consult/attention to medical team this a.m.

## 2015-11-23 NOTE — Progress Notes (Signed)
[facility].  Hanifa Antonetti, Wyman SongsterAngela Marie, RN

## 2015-11-23 NOTE — Progress Notes (Signed)
CSW spoke with patient regarding his discharge plans. [facility] Once cleared medically and by psych patient will return to live with his grandfather. Patient is his grandfathers primary caregiver due to his grandfather having dementia. Patient did not have any questions or concerns regarding care only mentioning the pain in his arm.   Stacy GardnerErin Md Smola, LCSWA Clinical Social Worker [phone]

## 2015-11-23 NOTE — [facility] Group Notes (Signed)
[facility] LCSW Group Therapy 11/23/2015 1:15pm  Type of Therapy: Group Therapy- Feelings Around Relapse and Recovery  Participation Level: Active   Participation Quality:  Appropriate  Affect:  Appropriate  Cognitive: Alert and Oriented   Insight:  Developing   Engagement in Therapy: Developing/Improving and Engaged   Modes of Intervention: Clarification, Confrontation, Discussion, Education, Exploration, Limit-setting, Orientation, Problem-solving, Rapport Building, Dance movement psychotherapisteality Testing, Socialization and Support  Summary of Progress/Problems: The topic for today was feelings about relapse. The group discussed what relapse prevention is to them and identified triggers that they are on the path to relapse. Members also processed their feeling towards relapse and were able to relate to common experiences. Group also discussed coping skills that can be used for relapse prevention.  Pt participated appropriately in group discussion and was able to identify warning signs, triggers, and high risk situations for substance abuse and depression. Pt expresses that he continues to struggle with aggression.    Therapeutic Modalities:   Cognitive Behavioral Therapy Solution-Focused Therapy Assertiveness Training Relapse Prevention Therapy    Lamar SprinklesLauren Carter, LCSWA [phone] 11/23/2015 2:47 PM

## 2015-11-23 NOTE — Progress Notes (Signed)
  [facility] Discharge [facility] :  Will you be returning to the same living situation after discharge:  Yes,  Pt to return to his grandfather's house At discharge, do you have transportation home?: Yes,  Pt family to pick up Do you have the ability to pay for your medications: Yes,  Pt provided with prescriptions  Release of information consent forms completed and in the chart;  Patient's signature needed at discharge.  Patient to Follow up at: Follow-up Information    Pt reports that he has an established provider and will follow-up on his own. Marland Kitchen.           Next level of care provider [facility]:no  Safety Planning and Suicide Prevention discussed: Yes,  with Pt; declines family contact  Have you used any form of tobacco in the last 30 days? (Cigarettes, Smokeless Tobacco, Cigars, and/or Pipes): Yes  Has patient been referred to the Quitline?: Patient refused referral  Patient has been referred for addiction treatment: Pt. refused referral  Njeri Vicente Lavonna RuaM Carter 11/23/2015, 2:48 PM

## 2015-11-23 NOTE — ED Provider Notes (Signed)
[facility]-EMERGENCY DEPT Provider Note   CSN: [id number] Arrival date & time: 11/23/15  1419     History   Chief Complaint Chief Complaint  Patient presents with  . Cellulitis  . Depression    HPI Marcus [name] is a 45 y.o. male.  The history is provided by the patient.   Pertinent negatives include no chest pain.  Abscess  Location:  Shoulder/arm Shoulder/arm abscess location:  R elbow Abscess quality: induration, painful, redness and warmth   Abscess quality: not draining and no fluctuance   Red streaking: yes   Progression:  Worsening Pain details:    Quality:  Pressure and sharp   Severity:  Moderate   Timing:  Constant   Progression:  Worsening Chronicity:  New Context: not diabetes and not skin injury   Relieved by:  Nothing Worsened by:  Nothing Ineffective treatments:  NSAIDs Associated symptoms: no anorexia, no fever and no nausea     Past Medical History:  Diagnosis Date  . Chronic back pain   . Suicidal overdose [facility])     Patient Active Problem List   Diagnosis Date Noted  . Cellulitis of left hand 11/23/2015  . Cellulitis of arm, right 11/23/2015  . Abscess of right arm 11/23/2015  . Seizure (HCC) 11/23/2015  . Cellulitis of left arm 11/22/2015  . Abscess of left arm 11/22/2015  . MDD (major depressive disorder), recurrent episode, severe (HCC) 11/21/2015  . Foot laceration involving tendon 07/31/2014  . Benzodiazepine dependence (HCC) 03/23/2014  . Alcohol intoxication (HCC)   . Overdose   . Major depressive disorder, recurrent, severe without psychotic features (HCC)   . Opioid dependence with opioid-induced mood disorder (HCC)   . MDD (major depressive disorder) (HCC) 01/11/2014  . Opiate abuse, continuous 05/20/2012    Class: Acute  . Benzodiazepine abuse, continuous 05/20/2012    Class: Acute    Past Surgical History:  Procedure Laterality Date  . None    . TENDON REPAIR Left 07/31/2014   Procedure: LEFT FOOT TENDON REPAIR;   Surgeon: Ollen Gross, MD;  Location: [facility];  Service: Orthopedics;  Laterality: Left;       Home Medications    Prior to Admission medications   Medication Sig Start Date End Date Taking? Authorizing Provider  acetaminophen (TYLENOL) 500 MG tablet Take 1,000 mg by mouth daily as needed for moderate pain.   Yes Historical Provider, MD  albuterol (PROVENTIL HFA;VENTOLIN HFA) 108 (90 Base) MCG/ACT inhaler Inhale 2 puffs into the lungs every 4 (four) hours as needed for wheezing or shortness of breath.   Yes Historical Provider, MD  amphetamine-dextroamphetamine (ADDERALL XR) 20 MG 24 hr capsule Take 20 mg by mouth daily.   Yes Historical Provider, MD  cholecalciferol (VITAMIN D) 1000 units tablet Take 1,000 Units by mouth daily.   Yes Historical Provider, MD  clonazePAM (KLONOPIN) 1 MG tablet Take 1 mg by mouth 2 (two) times daily.   Yes Historical Provider, MD  gabapentin (NEURONTIN) 400 MG capsule Take 400 mg by mouth 3 (three) times daily.   Yes Historical Provider, MD  metoprolol succinate (TOPROL-XL) 50 MG 24 hr tablet Take 50 mg by mouth daily. Take with or immediately following a meal.   Yes Historical Provider, MD  SUMAtriptan (IMITREX) 100 MG tablet Take 100 mg by mouth every 2 (two) hours as needed for migraine. May repeat in 2 hours if headache persists or recurs.   Yes Historical Provider, MD  testosterone cypionate (DEPOTESTOSTERONE CYPIONATE) 200 MG/ML injection  Inject 200 mg into the muscle every 14 (fourteen) days.   Yes Historical Provider, MD  thiamine (VITAMIN B-1) 100 MG tablet Take 100 mg by mouth daily.   Yes Historical Provider, MD  tiZANidine (ZANAFLEX) 4 MG tablet Take 4 mg by mouth every 8 (eight) hours as needed for muscle spasms.   Yes Historical Provider, MD  venlafaxine XR (EFFEXOR-XR) 150 MG 24 hr capsule Take 150 mg by mouth daily with breakfast.   Yes Historical Provider, MD  hydrOXYzine (ATARAX/VISTARIL) 50 MG tablet Take 1 tablet (50 mg total) by mouth every 8  (eight) hours as needed for anxiety. Patient not taking: Reported on 07/31/2014 03/24/14   Sanjuana KavaAgnes I Nwoko, NP  oxyCODONE-acetaminophen (PERCOCET) 5-325 MG per tablet Take 1-2 tablets by mouth every 6 (six) hours as needed for moderate pain or severe pain. Patient not taking: Reported on 11/22/2015 07/31/14   Ollen GrossFrank Aluisio, MD    Family History History reviewed. No pertinent family history.  Social History Social History  Substance Use Topics  . Smoking status: Former Games developermoker  . Smokeless tobacco: Current User    Types: Chew  . Alcohol use Yes     Comment: occ.     Allergies   Sulfa antibiotics; Benadryl [diphenhydramine hcl]; Bupropion; Flexeril  [cyclobenzaprine hcl]; Robaxin  [methocarbamol]; Seroquel [quetiapine fumarate]; and Trazodone and nefazodone   Review of Systems Review of Systems  Constitutional: Negative for activity change and fever.  Cardiovascular: Negative for chest pain.  Gastrointestinal: Negative for anorexia, diarrhea and nausea.  Neurological: Negative for dizziness.  Psychiatric/Behavioral: Negative for confusion.  All other systems reviewed and are negative.    Physical Exam Updated Vital Signs BP 124/81 (BP Location: Left Arm)   Pulse 76   Temp 97.7 F (36.5 C) (Oral)   Resp 15   Ht 6' (1.829 m)   Wt 199 lb (90.3 kg)   SpO2 97%   BMI 26.99 kg/m   Physical Exam  Constitutional: He appears well-developed and well-nourished.  HENT:  Head: Normocephalic and atraumatic.  Eyes: Conjunctivae are normal.  Neck: Neck supple.  Cardiovascular: Normal rate and regular rhythm.   No murmur heard. Pulmonary/Chest: Effort normal and breath sounds normal. No respiratory distress.  Abdominal: Soft. There is no tenderness.  Musculoskeletal: He exhibits no edema.  Neurological: He is alert.  Skin: Skin is warm and dry. There is erythema.  Large abscess on medial R antecubital fossa, surrounding erythema and warmthj  Small abscess to L hand   Psychiatric:  He has a normal mood and affect.  Nursing note and vitals reviewed.    ED Treatments / Results  Labs (all labs ordered are listed, but only abnormal results are displayed) Labs Reviewed  COMPREHENSIVE METABOLIC PANEL - Abnormal; Notable for the following:       Result Value   ALT 67 (*)    Alkaline Phosphatase 129 (*)    All other components within normal limits  LIPID PANEL - Abnormal; Notable for the following:    Triglycerides 189 (*)    HDL 32 (*)    LDL Cholesterol 106 (*)    All other components within normal limits  HEPATIC FUNCTION PANEL - Abnormal; Notable for the following:    ALT 68 (*)    Alkaline Phosphatase 127 (*)    All other components within normal limits  PROLACTIN - Abnormal; Notable for the following:    Prolactin 16.9 (*)    All other components within normal limits  CBC  HEMOGLOBIN  A1C  TSH    EKG  EKG Interpretation None       Radiology No results found.  Procedures Procedures (including critical care time)  Medications Ordered in ED Medications  oxyCODONE-acetaminophen (PERCOCET/ROXICET) 5-325 MG per tablet 1-2 tablet (1 tablet Oral Given 11/23/15 1405)  acetaminophen (TYLENOL) tablet 650 mg (650 mg Oral Given 11/23/15 1301)  alum & mag hydroxide-simeth (MAALOX/MYLANTA) 200-200-20 MG/5ML suspension 30 mL (not administered)  magnesium hydroxide (MILK OF MAGNESIA) suspension 30 mL (not administered)  doxepin (SINEQUAN) capsule 10 mg (10 mg Oral Not Given 11/22/15 2302)  venlafaxine XR (EFFEXOR-XR) 24 hr capsule 150 mg (150 mg Oral Given 11/23/15 0805)  gabapentin (NEURONTIN) capsule 400 mg (400 mg Oral Given 11/23/15 0805)  tiZANidine (ZANAFLEX) tablet 2 mg (2 mg Oral Given 11/22/15 2228)  amphetamine-dextroamphetamine (ADDERALL XR) 24 hr capsule 20 mg (20 mg Oral Given 11/23/15 0805)  clonazePAM (KLONOPIN) tablet 1 mg (1 mg Oral Given 11/23/15 0805)  doxycycline (VIBRA-TABS) tablet 100 mg (100 mg Oral Given 11/23/15 0805)  nicotine polacrilex  (NICORETTE) gum 2 mg (2 mg Oral Given 11/23/15 0825)  lidocaine (PF) (XYLOCAINE) 1 % injection 30 mL (30 mLs Other Given by Other 11/23/15 1711)  oxyCODONE-acetaminophen (PERCOCET/ROXICET) 5-325 MG per tablet 1 tablet (1 tablet Oral Given 11/23/15 1712)  clindamycin (CLEOCIN) IVPB 600 mg (600 mg Intravenous New Bag/Given 11/23/15 1840)  oxyCODONE-acetaminophen (PERCOCET/ROXICET) 5-325 MG per tablet 1 tablet (1 tablet Oral Given 11/23/15 1838)     Initial Impression / Assessment and Plan / ED Course  I have reviewed the triage vital signs and the nursing notes.  Pertinent labs & imaging results that were available during my care of the patient were reviewed by me and considered in my medical decision making (see chart for details).  Clinical Course   Patietn is a 45 yo male from [facility] presentin with abscess and cellulitis to R antecubital fossa. Started on abx today.  They called surgery he told them to come here. They spot is very indurated, not fluctuance.   US shows some small pockets of fluid with cobblestoning.   EMERGENCY DEPARTMENT US SOFT TISSUE INTERPRETATION "Study: Limited Soft Tissue Ultrasound"  INDICATIONS: Soft tissue infection Multiple views of the body part were obtained in real-time with a multi-frequency linear probe PERFORMED BY:  Myself IMAGES ARCHIVED?: Yes SIDE:Right  BODY PART:Upper extremity FINDINGS: Abcess present and Cellulitis present INTERPRETATION:  Abcess present and Cellulitis present     Attempted I and D- only blood.  INCISION AND DRAINAGE Performed by: Arlana Hove Consent: Verbal consent obtained. Risks and benefits: risks, benefits and alternatives were discussed Type: abscess  Body area: R antecubital fossa  Anesthesia: local infiltration  Incision was made with a scalpel.  Local anesthetic: lidocaine 1 w epinephrine  Anesthetic total: 3ml  Drainage: blood   Patient tolerance: Patient tolerated the procedure well with no  immediate complications.     7:15 PM Discussed with Dr. Magnus Ivan. He will consult on patient.   Wioll admit for IV abx.  Final Clinical Impressions(s) / ED Diagnoses   Final diagnoses:  None    New Prescriptions New Prescriptions   No medications on file     Tredarius Cobern Randall An, MD 11/23/15 [phone]
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
## 2015-11-23 NOTE — ED Notes (Addendum)
[facility].  Pt has cellulitis on medial R arm and L hand.  Per chart review, pt was started on an antibiotic yesterday.  Pain score 7/10.      [facility] d/t SI and depression.

## 2015-11-23 NOTE — Progress Notes (Signed)
[facility] MD Progress Note  11/23/2015 12:59 PM Marcus [name]  MRN:  [id number] Subjective:  Patient continues to complain of pain in his right arm --an area that looks indurated and with cellulitis. Patient was seen by the hospitalist yesterday who started him on doxycycline and recommended surgery consult. Surgery --Izora Gala [phone] consulted today and she recommended to have patient transferred to the [facility]. Patient reports improving mood and is participating in group milieu and individual activities Principal Problem: <principal problem not specified> Diagnosis:   Patient Active Problem List   Diagnosis Date Noted  . Cellulitis of left arm [L03.114] 11/22/2015  . Abscess of left arm [L02.414] 11/22/2015  . MDD (major depressive disorder), recurrent episode, severe ([place]) [F33.2] 11/21/2015  . Foot laceration involving tendon [I77.824M, P53.614E] 07/31/2014  . Benzodiazepine dependence ([place]) [F13.20] 03/23/2014  . Alcohol intoxication ([place]) [F10.129]   . Overdose [T50.901A]   . Major depressive disorder, recurrent, severe without psychotic features ([place]) [F33.2]   . Opioid dependence with opioid-induced mood disorder ([place]) [F11.24]   . MDD (major depressive disorder) ([place]) [F32.9] 01/11/2014  . Opiate abuse, continuous [F11.10] 05/20/2012    Class: Acute  . Benzodiazepine abuse, continuous [F13.10] 05/20/2012    Class: Acute   Total Time spent with patient: 35 minutesd  Past Psychiatric History: see admission assessment  Past Medical History:  Past Medical History:  Diagnosis Date  . Chronic back pain   . Suicidal overdose [facility])     Past Surgical History:  Procedure Laterality Date  . None    . TENDON REPAIR Left 07/31/2014   Procedure: LEFT FOOT TENDON REPAIR;  Surgeon: Gaynelle Arabian, MD;  Location: [facility];  Service: Orthopedics;  Laterality: Left;   Family History: History reviewed. No pertinent family history. Family Psychiatric  History: see admission  assessment Social History:  History  Alcohol Use  . Yes    Comment: occ.     History  Drug Use No    Comment: has been off methadone since 11/02/13    Social History   Social History  . Marital status: Legally Separated    Spouse name: N/A  . Number of children: N/A  . Years of education: N/A   Social History Main Topics  . Smoking status: Former Research scientist (life sciences)  . Smokeless tobacco: Current User    Types: Chew  . Alcohol use Yes     Comment: occ.  . Drug use: No     Comment: has been off methadone since 11/02/13  . Sexual activity: Not Asked   Other Topics Concern  . None   Social History Narrative  . None   Additional Social History:    Pain Medications: Pt denies Prescriptions: Pt denies  Over the Counter: Pt denies History of alcohol / drug use?: No history of alcohol / drug abuse Longest period of sobriety (when/how long): NA                    Sleep: Fair  Appetite:  Good  Current Medications: Current Facility-Administered Medications  Medication Dose Route Frequency Provider Last Rate Last Dose  . acetaminophen (TYLENOL) tablet 650 mg  650 mg Oral Q6H PRN Laverle Hobby, PA-C      . alum & mag hydroxide-simeth (MAALOX/MYLANTA) 200-200-20 MG/5ML suspension 30 mL  30 mL Oral Q4H PRN Laverle Hobby, PA-C      . amphetamine-dextroamphetamine (ADDERALL XR) 24 hr capsule 20 mg  20 mg Oral Daily Sueanne Margarita, MD  20 mg at 11/23/15 0805  . clonazePAM (KLONOPIN) tablet 1 mg  1 mg Oral BID Sueanne Margarita, MD   1 mg at 11/23/15 0805  . doxepin (SINEQUAN) capsule 10 mg  10 mg Oral QHS,MR X 1 Spencer E Simon, PA-C      . doxycycline (VIBRA-TABS) tablet 100 mg  100 mg Oral Q12H [place] Marisa Hua, MD   100 mg at 11/23/15 0805  . gabapentin (NEURONTIN) capsule 400 mg  400 mg Oral BID Sueanne Margarita, MD   400 mg at 11/23/15 0805  . magnesium hydroxide (MILK OF MAGNESIA) suspension 30 mL  30 mL Oral Daily PRN Laverle Hobby, PA-C      . nicotine polacrilex  (NICORETTE) gum 2 mg  2 mg Oral PRN Jenne Campus, MD   2 mg at 11/23/15 0825  . oxyCODONE-acetaminophen (PERCOCET/ROXICET) 5-325 MG per tablet 1-2 tablet  1-2 tablet Oral Q6H PRN Laverle Hobby, PA-C   1 tablet at 11/23/15 0805  . tiZANidine (ZANAFLEX) tablet 2 mg  2 mg Oral QHS Sueanne Margarita, MD   2 mg at 11/22/15 2228  . venlafaxine XR (EFFEXOR-XR) 24 hr capsule 150 mg  150 mg Oral Q breakfast Sueanne Margarita, MD   150 mg at 11/23/15 0805    Lab Results:  Results for orders placed or performed during the hospital encounter of 11/21/15 (from the past 48 hour(s))  CBC     Status: None   Collection Time: 11/22/15  6:58 AM  Result Value Ref Range   WBC 8.7 4.0 - 10.5 K/uL   RBC 5.12 4.22 - 5.81 MIL/uL   Hemoglobin 15.4 13.0 - 17.0 g/dL   HCT 45.6 39.0 - 52.0 %   MCV 89.1 78.0 - 100.0 fL   MCH 30.1 26.0 - 34.0 pg   MCHC 33.8 30.0 - 36.0 g/dL   RDW 13.0 11.5 - 15.5 %   Platelets 299 150 - 400 K/uL    Comment: Performed at [facility]  Comprehensive metabolic panel     Status: Abnormal   Collection Time: 11/22/15  6:58 AM  Result Value Ref Range   Sodium 139 135 - 145 mmol/L   Potassium 4.2 3.5 - 5.1 mmol/L   Chloride 102 101 - 111 mmol/L   CO2 29 22 - 32 mmol/L   Glucose, Bld 87 65 - 99 mg/dL   BUN 8 6 - 20 mg/dL   Creatinine, Ser 0.89 0.61 - 1.24 mg/dL   Calcium 9.1 8.9 - 10.3 mg/dL   Total Protein 7.7 6.5 - 8.1 g/dL   Albumin 3.9 3.5 - 5.0 g/dL   AST 41 15 - 41 U/L   ALT 67 (H) 17 - 63 U/L   Alkaline Phosphatase 129 (H) 38 - 126 U/L   Total Bilirubin 0.4 0.3 - 1.2 mg/dL   GFR calc non Af Amer >60 >60 mL/min   GFR calc Af Amer >60 >60 mL/min    Comment: (NOTE) The eGFR has been calculated using the CKD EPI equation. This calculation has not been validated in all clinical situations. eGFR's persistently <60 mL/min signify possible Chronic Kidney Disease.    Anion gap 8 5 - 15    Comment: Performed at [facility]  Hemoglobin A1c      Status: None   Collection Time: 11/22/15  6:58 AM  Result Value Ref Range   Hgb A1c MFr Bld 5.1 4.8 - 5.6 %    Comment: (  NOTE)         Pre-diabetes: 5.7 - 6.4         Diabetes: >6.4         Glycemic control for adults with diabetes: <7.0    Mean Plasma Glucose 100 mg/dL    Comment: (NOTE) Performed At: [facility] [place], [place] [id number] Lindon Romp MD WU:[id number] Performed at [facility]   Lipid panel     Status: Abnormal   Collection Time: 11/22/15  6:58 AM  Result Value Ref Range   Cholesterol 176 0 - 200 mg/dL   Triglycerides 189 (H) <150 mg/dL   HDL 32 (L) >40 mg/dL   Total CHOL/HDL Ratio 5.5 RATIO   VLDL 38 0 - 40 mg/dL   LDL Cholesterol 106 (H) 0 - 99 mg/dL    Comment:        Total Cholesterol/HDL:CHD Risk Coronary Heart Disease Risk Table                     Men   Women  1/2 Average Risk   3.4   3.3  Average Risk       5.0   4.4  2 X Average Risk   9.6   7.1  3 X Average Risk  23.4   11.0        Use the calculated Patient Ratio above and the CHD Risk Table to determine the patient's CHD Risk.        ATP III CLASSIFICATION (LDL):  <100     mg/dL   Optimal  100-129  mg/dL   Near or Above                    Optimal  130-159  mg/dL   Borderline  160-189  mg/dL   High  >190     mg/dL   Very High Performed at [facility]   Hepatic function panel     Status: Abnormal   Collection Time: 11/22/15  6:58 AM  Result Value Ref Range   Total Protein 7.9 6.5 - 8.1 g/dL   Albumin 3.9 3.5 - 5.0 g/dL   AST 41 15 - 41 U/L   ALT 68 (H) 17 - 63 U/L   Alkaline Phosphatase 127 (H) 38 - 126 U/L   Total Bilirubin 0.6 0.3 - 1.2 mg/dL   Bilirubin, Direct 0.1 0.1 - 0.5 mg/dL   Indirect Bilirubin 0.5 0.3 - 0.9 mg/dL    Comment: Performed at [facility]  TSH     Status: None   Collection Time: 11/22/15  6:58 AM  Result Value Ref Range   TSH 2.222 0.350 - 4.500 uIU/mL    Comment: Performed at  [facility]  Prolactin     Status: Abnormal   Collection Time: 11/22/15  6:58 AM  Result Value Ref Range   Prolactin 16.9 (H) 4.0 - 15.2 ng/mL    Comment: (NOTE) Performed At: [facility] [place], [place] [id number] Lindon Romp MD VH:[id number] Performed at [facility]     Blood Alcohol level:  Lab Results  Component Value Date   UXL 244 (H) 03/21/2014   ETH <11 02/26/7251    Metabolic Disorder Labs: Lab Results  Component Value Date   HGBA1C 5.1 11/22/2015   MPG 100 11/22/2015   MPG 114 01/13/2014   Lab Results  Component Value Date   PROLACTIN 16.9 (  H) 11/22/2015   Lab Results  Component Value Date   CHOL 176 11/22/2015   TRIG 189 (H) 11/22/2015   HDL 32 (L) 11/22/2015   CHOLHDL 5.5 11/22/2015   VLDL 38 11/22/2015   LDLCALC 106 (H) 11/22/2015    Physical Findings: AIMS: Facial and Oral Movements Muscles of Facial Expression: None, normal Lips and Perioral Area: None, normal Jaw: None, normal Tongue: None, normal,Extremity Movements Upper (arms, wrists, hands, fingers): None, normal Lower (legs, knees, ankles, toes): None, normal, Trunk Movements Neck, shoulders, hips: None, normal, Overall Severity Severity of abnormal movements (highest score from questions above): None, normal Incapacitation due to abnormal movements: None, normal Patient's awareness of abnormal movements (rate only patient's report): No Awareness, Dental Status Current problems with teeth and/or dentures?: No Does patient usually wear dentures?: No  CIWA:    COWS:     Musculoskeletal: Strength & Muscle Tone: within normal limits Gait & Station: normal Patient leans: N/A  Psychiatric Specialty Exam: Physical Exam  ROS  Blood pressure 128/74, pulse 96, temperature 98.3 F (36.8 C), temperature source Oral, resp. rate 16, height 6' (1.829 m), weight 90.3 kg (199 lb), SpO2 100 %.Body mass index is 26.99 kg/m.   General Appearance: Casual  Eye Contact:  Good  Speech:  Clear and Coherent and Normal Rate  Volume:  Normal  Mood:  Anxious and depressed  Affect:  Congruent  Thought Process:  Coherent and Goal Directed  Orientation:  Full (Time, Place, and Person)  Thought Content:  Logical  Suicidal Thoughts:  No  Homicidal Thoughts:  No  Memory:  Immediate;   Fair Recent;   Fair Remote;   Fair  Judgement:  Fair  Insight:  Fair  Psychomotor Activity:  Normal  Concentration:  Concentration: Good and Attention Span: Fair  Recall:  [employer] of Knowledge:  Fair  Language:  Fair  Akathisia:  No  Handed:  Right  AIMS (if indicated):     Assets:  Communication Skills Desire for Improvement Housing  ADL's:  Intact  Cognition:  WNL  Sleep:  Number of Hours: 6.5     Treatment Plan Summary: Daily contact with patient to assess and evaluate symptoms and progress in treatment and Medication management  Transfer to the [facility]  Ruffin Frederick, MD 11/23/2015, 12:59 PM

## 2015-11-23 NOTE — Progress Notes (Signed)
D: Patient A&Ox4, denies SI/HI and AVH. Patient is concerned with the abscesses on his RFA and LW. Abscesses wrapped with non adherent dressings and dry guaze for patient comfort. Patient stated he would really like to get an I&D of these abscesses asap. Patient stated he was never suicidal, it was more of a misunderstanding between him and his wife, who he has been separated from for 2 years. Patient added his wife, Sandi Ravelingndrea Vayda, to release information to, in order to expedite his discharge.  A: Patient given medications as scheduled and prn. Continue monitoring patient Q15 minutes for safety. MD made aware of the need for a surgical consult. R: Patient remains safe. Patient verbalized understanding to let staff know if he no longer feels safe.   Hiep Ollis, Wyman SongsterAngela Marie, RN

## 2015-11-24 DIAGNOSIS — L02414 Cutaneous abscess of left upper limb: Secondary | ICD-10-CM

## 2015-11-24 MED ORDER — GABAPENTIN 400 MG PO CAPS
400.0000 mg | ORAL_CAPSULE | Freq: Three times a day (TID) | ORAL | Status: DC
  Administered 2015-11-24 – 2015-11-28 (×12): 400 mg via ORAL
  Filled 2015-11-24 (×12): qty 1

## 2015-11-24 MED ORDER — SUMATRIPTAN SUCCINATE 100 MG PO TABS
100.0000 mg | ORAL_TABLET | ORAL | Status: DC | PRN
  Filled 2015-11-24: qty 1

## 2015-11-24 MED ORDER — VANCOMYCIN HCL IN DEXTROSE 1-5 GM/200ML-% IV SOLN
1000.0000 mg | Freq: Three times a day (TID) | INTRAVENOUS | Status: DC
  Administered 2015-11-24 – 2015-11-26 (×8): 1000 mg via INTRAVENOUS
  Filled 2015-11-24 (×4): qty 200

## 2015-11-24 MED ORDER — OXYCODONE HCL 5 MG PO TABS
10.0000 mg | ORAL_TABLET | Freq: Once | ORAL | Status: AC
  Administered 2015-11-24: 10 mg via ORAL
  Filled 2015-11-24: qty 2

## 2015-11-24 MED ORDER — VANCOMYCIN HCL IN DEXTROSE 1-5 GM/200ML-% IV SOLN: 1000.0000 mg | Freq: Once | INTRAVENOUS | Status: DC

## 2015-11-24 MED ORDER — CLONAZEPAM 1 MG PO TABS
1.0000 mg | ORAL_TABLET | Freq: Two times a day (BID) | ORAL | Status: DC
  Administered 2015-11-24 – 2015-11-28 (×9): 1 mg via ORAL
  Filled 2015-11-24 (×9): qty 1

## 2015-11-24 MED ORDER — TIZANIDINE HCL 4 MG PO TABS
4.0000 mg | ORAL_TABLET | Freq: Three times a day (TID) | ORAL | Status: DC | PRN
Start: 2015-11-24 — End: 2015-11-27

## 2015-11-24 MED ORDER — VITAMIN B-1 100 MG PO TABS
100.0000 mg | ORAL_TABLET | Freq: Every day | ORAL | Status: DC
  Administered 2015-11-24 – 2015-11-28 (×4): 100 mg via ORAL
  Filled 2015-11-24 (×5): qty 1

## 2015-11-24 MED ORDER — METOPROLOL SUCCINATE ER 50 MG PO TB24
50.0000 mg | ORAL_TABLET | Freq: Every day | ORAL | Status: DC
  Administered 2015-11-26: 50 mg via ORAL
  Filled 2015-11-24 (×2): qty 1

## 2015-11-24 MED ORDER — ENOXAPARIN SODIUM 40 MG/0.4ML ~~LOC~~ SOLN
40.0000 mg | SUBCUTANEOUS | Status: DC
  Administered 2015-11-25 – 2015-11-26 (×2): 40 mg via SUBCUTANEOUS
  Filled 2015-11-24 (×2): qty 0.4

## 2015-11-24 MED ORDER — OXYCODONE-ACETAMINOPHEN 5-325 MG PO TABS
1.0000 | ORAL_TABLET | Freq: Four times a day (QID) | ORAL | Status: DC | PRN
  Administered 2015-11-24 – 2015-11-27 (×10): 2 via ORAL
  Filled 2015-11-24 (×10): qty 2

## 2015-11-24 MED ORDER — OXYCODONE-ACETAMINOPHEN 5-325 MG PO TABS
2.0000 | ORAL_TABLET | Freq: Once | ORAL | Status: AC
  Administered 2015-11-24: 2 via ORAL
  Filled 2015-11-24: qty 2

## 2015-11-24 MED ORDER — AMPHETAMINE-DEXTROAMPHET ER 20 MG PO CP24
20.0000 mg | ORAL_CAPSULE | Freq: Every day | ORAL | Status: DC
  Administered 2015-11-24 – 2015-11-28 (×4): 20 mg via ORAL
  Filled 2015-11-24 (×4): qty 1

## 2015-11-24 MED ORDER — ALBUTEROL SULFATE (2.5 MG/3ML) 0.083% IN NEBU: 3.0000 mL | INHALATION_SOLUTION | RESPIRATORY_TRACT | Status: DC | PRN

## 2015-11-24 MED ORDER — VENLAFAXINE HCL ER 150 MG PO CP24
150.0000 mg | ORAL_CAPSULE | Freq: Every day | ORAL | Status: DC
  Administered 2015-11-25 – 2015-11-28 (×4): 150 mg via ORAL
  Filled 2015-11-24 (×4): qty 1

## 2015-11-24 NOTE — Progress Notes (Signed)
[facility]  PROGRESS NOTE  Marcus [name] ZOX:[id number] DOB: [birth year] DOA: 11/23/2015 PCP: Tarri Fuller, MD   Brief HPI:  45 year old male with a history of depression, anxiety, low back pain, opioid and benzodiazepine dependence, peripheral vascular disease who was brought to ED from [facility] for progressive pain and swelling in right arm and left hand. Patient found to have abscess in right arm and started on IV antibiotics. Orthopedics consulted. .   Subjective   Patient continues to have pain in the right, and left hand   Assessment/Plan:     1. Right arm cellulitis with abscess- started on vancomycin, pain control with oxycodone. Orthopedic surgery has seen the patient and plan for incision and drainage in a.m. 2. Left hand cellulitis/abscess- same as above, incision and drainage in a.m.  3. Depression- continue suicide precautions.Continue Effexor, clonazepam. 4. Reported seizures over past four months- No seizures in the hospital, patient had an appointment to see neurology as outpatient. Consider neurology consultation if patient develops seizures in the hospital. Currently on IV Ativan when necessary for seizure 5. Mild transaminitis- patient has history of positive hep C antibody      DVT prophylaxis: Lovenox  Code Status: Full code  Family Communication: No family present at bedside   Disposition Plan: To be decided, home versus [facility]   Consultants:  Orthopedics  Procedures:  None  Antibiotics:   Anti-infectives    Start     Dose/Rate Route Frequency Ordered Stop   11/24/15 1000  vancomycin (VANCOCIN) IVPB 1000 mg/200 mL premix     1,000 mg 200 mL/hr over 60 Minutes Intravenous Every 8 hours 11/24/15 0950     11/24/15 0945  vancomycin (VANCOCIN) IVPB 1000 mg/200 mL premix  Status:  Discontinued     1,000 mg 200 mL/hr over 60 Minutes Intravenous  Once 11/24/15 0932 11/24/15 0949       Objective   Vitals:   11/23/15 2200 11/24/15 0628  11/24/15 1410  BP: 130/77 119/79 122/77  Pulse: 79 65 94  Resp: 20 18 18   Temp: 97.9 F (36.6 C) 98.5 F (36.9 C) 98.1 F (36.7 C)  TempSrc: Oral Oral Oral  SpO2:   100%  Weight: 91.3 kg (201 lb 4.5 oz)     No intake or output data in the 24 hours ending 11/24/15 1505 Filed Weights   11/23/15 2200  Weight: 91.3 kg (201 lb 4.5 oz)     Physical Examination:  General exam: Appears calm and comfortable. Respiratory system: Clear to auscultation. Respiratory effort normal. Cardiovascular system:  RRR. No  murmurs, rubs, gallops. No pedal edema. GI system: Abdomen is nondistended, soft and nontender. No organomegaly.  Central nervous system. No focal neurological deficits. 5 x 5 power in all extremities. Psychiatry: Alert, oriented x 3.Judgement and insight appear normal. Affect normal. Skin/ .Musculoskeletal:       Arms:      Hands:     Data Reviewed: I have personally reviewed following labs and imaging studies  CBG: No results for input(s): GLUCAP in the last 168 hours.  CBC:  Recent Labs Lab 11/22/15 0658  WBC 8.7  HGB 15.4  HCT 45.6  MCV 89.1  PLT 299    Basic Metabolic Panel:  Recent Labs Lab 11/22/15 0658  NA 139  K 4.2  CL 102  CO2 29  GLUCOSE 87  BUN 8  CREATININE 0.89  CALCIUM 9.1    No results found for this or any previous visit (from the past 240  hour(s)).   Liver Function Tests:  Recent Labs Lab 11/22/15 0658  AST 41  41  ALT 68*  67*  ALKPHOS 127*  129*  BILITOT 0.6  0.4  PROT 7.9  7.7  ALBUMIN 3.9  3.9   No results for input(s): LIPASE, AMYLASE in the last 168 hours. No results for input(s): AMMONIA in the last 168 hours.  Cardiac Enzymes: No results for input(s): CKTOTAL, CKMB, CKMBINDEX, TROPONINI in the last 168 hours. BNP (last 3 results) No results for input(s): BNP in the last 8760 hours.  ProBNP (last 3 results) No results for input(s): PROBNP in the last 8760 hours.    Studies: No results  found.  Scheduled Meds: . amphetamine-dextroamphetamine  20 mg Oral Daily  . clonazePAM  1 mg Oral BID  . gabapentin  400 mg Oral TID  . metoprolol succinate  50 mg Oral Daily  . thiamine  100 mg Oral Daily  . vancomycin  1,000 mg Intravenous Q8H  . [START ON 11/25/2015] venlafaxine XR  150 mg Oral Q breakfast    Continuous Infusions:   Time spent: 20 min  [facility]tr[facility],Corrine Tillis S   [facility] Pager [phone]. If 7PM-7AM, please contact night-coverage at [email or web address], Office  [phone]  password [email or web address] 11/24/2015, 3:05 PM  LOS: 1 day

## 2015-11-24 NOTE — Consult Note (Signed)
Reason for Consult: right arm and left hand infections Referring Physician: EDP  Marcus CoupeLonnie R [name] is an 45 y.o. male.  HPI:   45 yo male with a history of worsening right forearm pain and left hand pain of uncertain etiology.  Was admitted last evening for treatment of cellulitis and started on IV antibiotics.  Ortho asked to assess for potential surgery.  The patient does report pain in these areas.  Past Medical History:  Diagnosis Date  . Chronic back pain   . Suicidal overdose Marcus [name](HCC)     Past Surgical History:  Procedure Laterality Date  . None    . TENDON REPAIR Left 07/31/2014   Procedure: LEFT FOOT TENDON REPAIR;  Surgeon: Ollen GrossFrank Aluisio, MD;  Location: [facility];  Service: Orthopedics;  Laterality: Left;    No family history on file.  Social History:  reports that he has quit smoking. His smokeless tobacco use includes Chew. He reports that he drinks alcohol. He reports that he does not use drugs.  Allergies:  Allergies  Allergen Reactions  . Sulfa Antibiotics Nausea And Vomiting  . Benadryl [Diphenhydramine Hcl]     " restless leg syndrome"  . Bupropion     Insomnia  . Flexeril  [Cyclobenzaprine Hcl]     Restless leg  . Robaxin  [Methocarbamol]     Restless leg syndrome  . Seroquel [Quetiapine Fumarate]     "Restless leg syndrome"  . Trazodone And Nefazodone     "restless leg syndrome"    Medications: I have reviewed the patient's current medications.  No results found for this or any previous visit (from the past 48 hour(s)).  No results found.  ROS Blood pressure 119/79, pulse 65, temperature 98.5 F (36.9 C), temperature source Oral, resp. rate 18, weight 91.3 kg (201 lb 4.5 oz). Physical Exam  Constitutional: He is oriented to person, place, and time. He appears well-developed and well-nourished.  HENT:  Head: Normocephalic and atraumatic.  Neck: Normal range of motion. Neck supple.  Cardiovascular: Normal rate and regular rhythm.   Respiratory: Breath  sounds normal.  GI: Soft. Bowel sounds are normal.  Musculoskeletal:       Arms:      Hands: Neurological: He is alert and oriented to person, place, and time.  Skin: Skin is warm and dry.   Both UE with normal motor and sensory function   Assessment/Plan: Right forearm and left hand infection/cellulitis/abscesses 1)  I spoke to Marcus [name] in length about the need for surgery to wash out his right arm and left hand infections.  He understands this.  We will proceed to surgery tomorrow am 10/1.  Kathryne HitchChristopher Y Blackman 11/24/2015, 1:14 PM

## 2015-11-24 NOTE — Progress Notes (Signed)
Pharmacy Antibiotic Note  Marcus [name] is a 45 y.o. male admitted on 11/23/2015 with progressive pain and swelling in his right arm and left hand from cellulitis with abscess.  He was started on PO doxycyline on 9/28, but has progressed.  ED attempted I&D on probable abscess, but found no purulent drainage, only blood.  Ortho hand consult pending.  Pharmacy has been consulted for Vancomycin dosing.  Plan: Vancomycin 1g IV q8h. Measure Vanc trough at steady state - goal VT 10-15 for cellulitis. Follow up renal fxn, culture results, and clinical course.  Weight: 201 lb 4.5 oz (91.3 kg)  Temp (24hrs), Avg:98 F (36.7 C), Min:97.7 F (36.5 C), Max:98.5 F (36.9 C)   Recent Labs Lab 11/22/15 0658  WBC 8.7  CREATININE 0.89    Estimated Creatinine Clearance: 115 mL/min (by C-G formula based on SCr of 0.89 mg/dL).    Allergies  Allergen Reactions  . Sulfa Antibiotics Nausea And Vomiting  . Benadryl [Diphenhydramine Hcl]     " restless leg syndrome"  . Bupropion     Insomnia  . Flexeril  [Cyclobenzaprine Hcl]     Restless leg  . Robaxin  [Methocarbamol]     Restless leg syndrome  . Seroquel [Quetiapine Fumarate]     "Restless leg syndrome"  . Trazodone And Nefazodone     "restless leg syndrome"    Antimicrobials this admission: 9/28 Doxy PO >> 9/29 9/29 Clinda IV >> 9/29 9/30 Vanc >>  Dose adjustments this admission:   Microbiology results: None  Thank you for allowing pharmacy to be a part of this patient's care.  Lynann Beaverhristine Marcus [name] PharmD, BCPS Pager [phone] 11/24/2015 9:37 AM

## 2015-11-25 ENCOUNTER — Observation Stay (HOSPITAL_COMMUNITY): Payer: Medicaid Other | Admitting: Certified Registered"

## 2015-11-25 ENCOUNTER — Encounter (HOSPITAL_COMMUNITY): Admission: AD | Disposition: A | Discharge status: Home / Self Care | Payer: Self-pay | Attending: Nephrology

## 2015-11-25 DIAGNOSIS — R569 Unspecified convulsions: Secondary | ICD-10-CM

## 2015-11-25 DIAGNOSIS — L03113 Cellulitis of right upper limb: Secondary | ICD-10-CM | POA: Diagnosis present

## 2015-11-25 DIAGNOSIS — F322 Major depressive disorder, single episode, severe without psychotic features: Secondary | ICD-10-CM | POA: Diagnosis not present

## 2015-11-25 DIAGNOSIS — Z915 Personal history of self-harm: Secondary | ICD-10-CM | POA: Diagnosis not present

## 2015-11-25 DIAGNOSIS — Z87891 Personal history of nicotine dependence: Secondary | ICD-10-CM | POA: Diagnosis not present

## 2015-11-25 DIAGNOSIS — M65031 Abscess of tendon sheath, right forearm: Secondary | ICD-10-CM

## 2015-11-25 DIAGNOSIS — Z720 Tobacco use: Secondary | ICD-10-CM | POA: Diagnosis not present

## 2015-11-25 DIAGNOSIS — L03114 Cellulitis of left upper limb: Secondary | ICD-10-CM | POA: Diagnosis present

## 2015-11-25 DIAGNOSIS — I739 Peripheral vascular disease, unspecified: Secondary | ICD-10-CM | POA: Diagnosis present

## 2015-11-25 DIAGNOSIS — M71042 Abscess of bursa, left hand: Secondary | ICD-10-CM | POA: Diagnosis not present

## 2015-11-25 DIAGNOSIS — M79601 Pain in right arm: Secondary | ICD-10-CM | POA: Diagnosis present

## 2015-11-25 DIAGNOSIS — L02413 Cutaneous abscess of right upper limb: Secondary | ICD-10-CM | POA: Diagnosis present

## 2015-11-25 DIAGNOSIS — L02512 Cutaneous abscess of left hand: Secondary | ICD-10-CM | POA: Diagnosis present

## 2015-11-25 DIAGNOSIS — Z79891 Long term (current) use of opiate analgesic: Secondary | ICD-10-CM | POA: Diagnosis not present

## 2015-11-25 DIAGNOSIS — Z79899 Other long term (current) drug therapy: Secondary | ICD-10-CM | POA: Diagnosis not present

## 2015-11-25 DIAGNOSIS — G8929 Other chronic pain: Secondary | ICD-10-CM | POA: Diagnosis present

## 2015-11-25 DIAGNOSIS — F329 Major depressive disorder, single episode, unspecified: Secondary | ICD-10-CM | POA: Diagnosis present

## 2015-11-25 DIAGNOSIS — L02414 Cutaneous abscess of left upper limb: Secondary | ICD-10-CM | POA: Diagnosis not present

## 2015-11-25 DIAGNOSIS — R740 Nonspecific elevation of levels of transaminase and lactic acid dehydrogenase [LDH]: Secondary | ICD-10-CM | POA: Diagnosis present

## 2015-11-25 DIAGNOSIS — M545 Low back pain: Secondary | ICD-10-CM | POA: Diagnosis present

## 2015-11-25 HISTORY — PX: I&D EXTREMITY: SHX5045

## 2015-11-25 LAB — CBC
HCT: 42 % (ref 39.0–52.0)
Hemoglobin: 14.2 g/dL (ref 13.0–17.0)
MCH: 29.8 pg (ref 26.0–34.0)
MCHC: 33.8 g/dL (ref 30.0–36.0)
MCV: 88.2 fL (ref 78.0–100.0)
Platelets: 280 10*3/uL (ref 150–400)
RBC: 4.76 MIL/uL (ref 4.22–5.81)
RDW: 12.7 % (ref 11.5–15.5)
WBC: 12.7 10*3/uL — ABNORMAL HIGH (ref 4.0–10.5)

## 2015-11-25 LAB — BASIC METABOLIC PANEL
ANION GAP: 6 (ref 5–15)
BUN: 10 mg/dL (ref 6–20)
CHLORIDE: 100 mmol/L — AB (ref 101–111)
CO2: 32 mmol/L (ref 22–32)
Calcium: 9 mg/dL (ref 8.9–10.3)
Creatinine, Ser: 0.99 mg/dL (ref 0.61–1.24)
GFR calc Af Amer: >60 mL/min (ref >60)
GLUCOSE: 106 mg/dL — AB (ref 65–99)
POTASSIUM: 3.9 mmol/L (ref 3.5–5.1)
Sodium: 138 mmol/L (ref 135–145)

## 2015-11-25 LAB — MRSA PCR SCREENING: MRSA by PCR: NEGATIVE

## 2015-11-25 SURGERY — IRRIGATION AND DEBRIDEMENT EXTREMITY
Anesthesia: General | Site: Arm Lower | Laterality: Bilateral

## 2015-11-25 MED ORDER — MEPERIDINE HCL 50 MG/ML IJ SOLN: 6.2500 mg | INTRAMUSCULAR | Status: DC | PRN

## 2015-11-25 MED ORDER — MIDAZOLAM HCL 2 MG/2ML IJ SOLN
INTRAMUSCULAR | Status: AC
  Filled 2015-11-25: qty 2

## 2015-11-25 MED ORDER — HYDROMORPHONE HCL 1 MG/ML IJ SOLN
INTRAMUSCULAR | Status: AC
  Filled 2015-11-25: qty 1

## 2015-11-25 MED ORDER — 0.9 % SODIUM CHLORIDE (POUR BTL) OPTIME
TOPICAL | Status: DC | PRN
Start: 2015-11-25 — End: 2015-11-25
  Administered 2015-11-25: 1000 mL

## 2015-11-25 MED ORDER — PROPOFOL 10 MG/ML IV BOLUS
INTRAVENOUS | Status: DC | PRN
  Administered 2015-11-25: 200 mg via INTRAVENOUS

## 2015-11-25 MED ORDER — HYDROMORPHONE HCL 1 MG/ML IJ SOLN
1.0000 mg | INTRAMUSCULAR | Status: DC | PRN
  Administered 2015-11-25: 1 mg via INTRAVENOUS
  Filled 2015-11-25: qty 1

## 2015-11-25 MED ORDER — CHLORHEXIDINE GLUCONATE 4 % EX LIQD: 60.0000 mL | Freq: Once | CUTANEOUS | Status: DC

## 2015-11-25 MED ORDER — ONDANSETRON HCL 4 MG/2ML IJ SOLN: 4.0000 mg | Freq: Once | INTRAMUSCULAR | Status: DC | PRN

## 2015-11-25 MED ORDER — OXYCODONE HCL 5 MG/5ML PO SOLN
5.0000 mg | Freq: Once | ORAL | Status: AC | PRN
  Filled 2015-11-25: qty 5

## 2015-11-25 MED ORDER — OXYCODONE HCL 5 MG PO TABS
5.0000 mg | ORAL_TABLET | ORAL | Status: DC | PRN
  Administered 2015-11-26: 5 mg via ORAL
  Administered 2015-11-26 – 2015-11-28 (×9): 10 mg via ORAL
  Filled 2015-11-25 (×11): qty 2

## 2015-11-25 MED ORDER — DEXAMETHASONE SODIUM PHOSPHATE 10 MG/ML IJ SOLN
INTRAMUSCULAR | Status: AC
  Filled 2015-11-25: qty 1

## 2015-11-25 MED ORDER — PROPOFOL 10 MG/ML IV BOLUS
INTRAVENOUS | Status: AC
  Filled 2015-11-25: qty 20

## 2015-11-25 MED ORDER — FENTANYL CITRATE (PF) 100 MCG/2ML IJ SOLN
INTRAMUSCULAR | Status: DC | PRN
  Administered 2015-11-25 (×4): 50 ug via INTRAVENOUS

## 2015-11-25 MED ORDER — OXYCODONE HCL 5 MG PO TABS
5.0000 mg | ORAL_TABLET | Freq: Once | ORAL | Status: AC | PRN
  Administered 2015-11-25: 5 mg via ORAL

## 2015-11-25 MED ORDER — LACTATED RINGERS IV SOLN
INTRAVENOUS | Status: DC | PRN
  Administered 2015-11-25: 10:00:00 via INTRAVENOUS

## 2015-11-25 MED ORDER — LACTATED RINGERS IV SOLN: INTRAVENOUS | Status: DC

## 2015-11-25 MED ORDER — FENTANYL CITRATE (PF) 100 MCG/2ML IJ SOLN
INTRAMUSCULAR | Status: AC
  Filled 2015-11-25: qty 2

## 2015-11-25 MED ORDER — DEXAMETHASONE SODIUM PHOSPHATE 10 MG/ML IJ SOLN
INTRAMUSCULAR | Status: DC | PRN
  Administered 2015-11-25: 10 mg via INTRAVENOUS

## 2015-11-25 MED ORDER — HYDROMORPHONE HCL 1 MG/ML IJ SOLN
INTRAMUSCULAR | Status: DC
Start: 2015-11-25 — End: 2015-11-25
  Filled 2015-11-25: qty 1

## 2015-11-25 MED ORDER — POVIDONE-IODINE 10 % EX SWAB: 2.0000 "application " | Freq: Once | CUTANEOUS | Status: DC

## 2015-11-25 MED ORDER — MIDAZOLAM HCL 5 MG/5ML IJ SOLN
INTRAMUSCULAR | Status: DC | PRN
  Administered 2015-11-25: 2 mg via INTRAVENOUS

## 2015-11-25 MED ORDER — HYDROMORPHONE HCL 1 MG/ML IJ SOLN
0.5000 mg | INTRAMUSCULAR | Status: AC | PRN
  Administered 2015-11-25 (×4): 0.5 mg via INTRAVENOUS

## 2015-11-25 MED ORDER — HYDROMORPHONE HCL 1 MG/ML IJ SOLN
0.2500 mg | INTRAMUSCULAR | Status: AC | PRN
  Administered 2015-11-25 (×8): 0.5 mg via INTRAVENOUS

## 2015-11-25 MED ORDER — OXYCODONE HCL 5 MG PO TABS
ORAL_TABLET | ORAL | Status: AC
  Filled 2015-11-25: qty 1

## 2015-11-25 MED ORDER — ONDANSETRON HCL 4 MG/2ML IJ SOLN
INTRAMUSCULAR | Status: AC
  Filled 2015-11-25: qty 2

## 2015-11-25 MED ORDER — ONDANSETRON HCL 4 MG/2ML IJ SOLN
INTRAMUSCULAR | Status: DC | PRN
  Administered 2015-11-25: 4 mg via INTRAVENOUS

## 2015-11-25 SURGICAL SUPPLY — 40 items
BAG SPEC THK2 15X12 ZIP CLS (MISCELLANEOUS) ×1
BAG ZIPLOCK 12X15 (MISCELLANEOUS) ×3 IMPLANT
BANDAGE ACE 4X5 VEL STRL LF (GAUZE/BANDAGES/DRESSINGS) ×2 IMPLANT
BANDAGE ESMARK 6X9 LF (GAUZE/BANDAGES/DRESSINGS) ×1 IMPLANT
BNDG CMPR 9X6 STRL LF SNTH (GAUZE/BANDAGES/DRESSINGS)
BNDG COHESIVE 1X5 TAN STRL LF (GAUZE/BANDAGES/DRESSINGS) ×2 IMPLANT
BNDG CONFORM 2 STRL LF (GAUZE/BANDAGES/DRESSINGS) ×2 IMPLANT
BNDG ESMARK 6X9 LF (GAUZE/BANDAGES/DRESSINGS)
BNDG GAUZE ELAST 4 BULKY (GAUZE/BANDAGES/DRESSINGS) ×3 IMPLANT
CUFF TOURN SGL QUICK 18 (TOURNIQUET CUFF) IMPLANT
CUFF TOURN SGL QUICK 24 (TOURNIQUET CUFF)
CUFF TOURN SGL QUICK 34 (TOURNIQUET CUFF)
CUFF TRNQT CYL 24X4X40X1 (TOURNIQUET CUFF) IMPLANT
CUFF TRNQT CYL 34X4X40X1 (TOURNIQUET CUFF) IMPLANT
DRAIN PENROSE 18X1/2 LTX STRL (DRAIN) IMPLANT
DRSG PAD ABDOMINAL 8X10 ST (GAUZE/BANDAGES/DRESSINGS) ×6 IMPLANT
DURAPREP 26ML APPLICATOR (WOUND CARE) ×3 IMPLANT
ELECT REM PT RETURN 9FT ADLT (ELECTROSURGICAL) ×3
ELECTRODE REM PT RTRN 9FT ADLT (ELECTROSURGICAL) ×1 IMPLANT
GAUZE SPONGE 4X4 12PLY STRL (GAUZE/BANDAGES/DRESSINGS) ×5 IMPLANT
GAUZE XEROFORM 1X8 LF (GAUZE/BANDAGES/DRESSINGS) ×4 IMPLANT
GLOVE BIO SURGEON STRL SZ7.5 (GLOVE) ×6 IMPLANT
GLOVE BIOGEL PI IND STRL 8 (GLOVE) ×1 IMPLANT
GLOVE BIOGEL PI INDICATOR 8 (GLOVE) ×2
GLOVE ECLIPSE 8.0 STRL XLNG CF (GLOVE) ×3 IMPLANT
GOWN STRL REUS W/TWL XL LVL3 (GOWN DISPOSABLE) ×3 IMPLANT
HANDPIECE INTERPULSE COAX TIP (DISPOSABLE)
KIT BASIN OR (CUSTOM PROCEDURE TRAY) ×3 IMPLANT
PACK ORTHO EXTREMITY (CUSTOM PROCEDURE TRAY) ×3 IMPLANT
PAD ABD 8X10 STRL (GAUZE/BANDAGES/DRESSINGS) ×3 IMPLANT
PAD CAST 4YDX4 CTTN HI CHSV (CAST SUPPLIES) ×1 IMPLANT
PADDING CAST COTTON 4X4 STRL (CAST SUPPLIES) ×3
POSITIONER SURGICAL ARM (MISCELLANEOUS) IMPLANT
SET HNDPC FAN SPRY TIP SCT (DISPOSABLE) ×1 IMPLANT
SWAB COLLECTION DEVICE MRSA (MISCELLANEOUS) ×3 IMPLANT
SYR BULB IRRIGATION 50ML (SYRINGE) ×2 IMPLANT
SYR CONTROL 10ML LL (SYRINGE) ×3 IMPLANT
TOWEL OR 17X26 10 PK STRL BLUE (TOWEL DISPOSABLE) ×4 IMPLANT
TUBE ANAEROBIC SPECIMEN COL (MISCELLANEOUS) ×3 IMPLANT
YANKAUER SUCT BULB TIP NO VENT (SUCTIONS) ×4 IMPLANT

## 2015-11-25 NOTE — Anesthesia Postprocedure Evaluation (Signed)
Anesthesia Post Note  Patient: Marcus [name]  Procedure(s) Performed: Procedure(s) (LRB): IRRIGATION AND DEBRIDEMENT EXTREMITY RIGHT ARM AND LEFT HAND (Bilateral)  Patient location during evaluation: PACU Anesthesia Type: General Level of consciousness: awake and alert Pain management: pain level controlled Vital Signs Assessment: post-procedure vital signs reviewed and stable Respiratory status: spontaneous breathing, nonlabored ventilation, respiratory function stable and patient connected to face mask oxygen Cardiovascular status: blood pressure returned to baseline and stable Postop Assessment: no signs of nausea or vomiting Anesthetic complications: no    Last Vitals:  Vitals:   11/25/15 1136 11/25/15 1145  BP: (!) 125/91 (!) 128/96  Pulse: 80 77  Resp: (!) 22 15  Temp:      Last Pain:  Vitals:   11/25/15 1145  TempSrc:   PainSc: 8                  Falicia Lizotte A

## 2015-11-25 NOTE — Anesthesia Procedure Notes (Signed)
Procedure Name: LMA Insertion Date/Time: 11/25/2015 10:32 AM Performed by: Early OsmondEARGLE, Lonni Dirden E Pre-anesthesia Checklist: Patient identified, Emergency Drugs available, Suction available and Patient being monitored Patient Re-evaluated:Patient Re-evaluated prior to inductionOxygen Delivery Method: Circle system utilized Preoxygenation: Pre-oxygenation with 100% oxygen Intubation Type: IV induction Ventilation: Mask ventilation without difficulty LMA: LMA inserted LMA Size: 4.0 Number of attempts: 1 Placement Confirmation: breath sounds checked- equal and bilateral and positive ETCO2 Tube secured with: Tape Dental Injury: Teeth and Oropharynx as per pre-operative assessment

## 2015-11-25 NOTE — Brief Op Note (Signed)
11/23/2015 - 11/25/2015  11:21 AM  PATIENT:  Marcus [name]  45 y.o. male  PRE-OPERATIVE DIAGNOSIS:  INFECTED RIGHT ARM AND LEFT HAND  POST-OPERATIVE DIAGNOSIS:  INFECTED RIGHT ARM AND LEFT HAND  PROCEDURE:  Procedure(s): IRRIGATION AND DEBRIDEMENT EXTREMITY RIGHT ARM AND LEFT HAND (Bilateral)  SURGEON:  Surgeon(s) and Role:    * Kathryne Hitchhristopher Y Nareh Matzke, MD - Primary  ANESTHESIA:   general  EBL:  Total I/O In: -  Out: 50 [Blood:50]  COUNTS:  YES  TOURNIQUET:  * No tourniquets in log *  DICTATION: .Other Dictation: Dictation Number [id number]  PLAN OF CARE: Admit to inpatient   PATIENT DISPOSITION:  PACU - hemodynamically stable.   Delay start of Pharmacological VTE agent (>24hrs) due to surgical blood loss or risk of bleeding: no

## 2015-11-25 NOTE — Progress Notes (Signed)
Patient ID: Marcus [name], male   DOB: [birth year], 45 y.o.   MRN: [id number] Significant abscesses found in right forearm and left hand.  He will need at least 48 hours of IV antibiotics before discharging on 2 weeks of oral antibiotics. Will follow.

## 2015-11-25 NOTE — Progress Notes (Signed)
PROGRESS NOTE    Marcus [name]  PIR:[id number] DOB: 08/09/1970 DOA: 11/23/2015 PCP: [name], [name], MD    Brief Narrative: 45 year old male with a history of depression, anxiety, low back pain, opioid and benzodiazepine dependence, peripheral vascular disease who [facility] for progressive pain and swelling in right arm and left hand. Patient found to have abscess in right arm and started on IV antibiotics. Orthopedics consulted.  Assessment & Plan:   # Right forearm and left hand cellulitis and abscess: Status post I&D by orthopedics today. Consult appreciated. Follow up culture results. Continue IV antibiotics, vancomycin. Pain controlled. Continue supportive care.  # MDD (major depressive disorder) (HCC): On suicide precautions. Continue current medications. On Klonopin and Effexor.   #  Reported history of Seizure [facility](HCC): No seizure activity in the hospital. Continue seizure precautions. Advised outpatient follow-up with neurologist.  # Mild transaminitis: As per medical record patient has history of positive hepatitis C antibody. Advised outpatient follow-up.  DVT prophylaxis: Lovenox subcutaneous Code Status: Full code Family Communication: No family present at bedside Disposition Plan: Likely transfer to behavioral health in 1-2 days  Consultants:   Orthopedics  Procedures: Antimicrobials: On IV vancomycin since September 30  Subjective: Patient was seen and examined at bedside. Reports severe pain on right arm and left hand. Had chills denies fever. No nausea vomiting or abdominal pain. Denied chest pain or shortness of breath. Patient has seizure at bedside.  Objective: Vitals:   11/25/15 1215 11/25/15 1230 11/25/15 1245 11/25/15 1250  BP: 117/84 110/74 (!) 126/109 134/79  Pulse: 72 72 85 86  Resp: 17 18 17    Temp:  98 F (36.7 C)  98.3 F (36.8 C)  TempSrc:      SpO2: 100% 100% 98% 100%  Weight:        Intake/Output Summary (Last 24  hours) at 11/25/15 1423 Last data filed at 11/25/15 1413  Gross per 24 hour  Intake              880 ml  Output               50 ml  Net              830 ml   Filed Weights   11/23/15 2200  Weight: 91.3 kg (201 lb 4.5 oz)    Examination:  General exam: Appears calm and comfortable  Respiratory system: Clear to auscultation. Respiratory effort normal. Cardiovascular system: S1 & S2 heard, RRR.  No pedal edema. Gastrointestinal system: Abdomen is nondistended, soft and nontender.  Normal bowel sounds heard. Central nervous system: Alert and oriented. No focal neurological deficits. Extremities: No lower extremity edema. As right forearm and left hand indicated mass. Skin: Erythematous indurated warm abscess on right arm and left dorsal hand. Psychiatry: Judgement and insight appear normal. Denied suicidal ideation.     Data Reviewed: I have personally reviewed following labs and imaging studies  CBC:  Recent Labs Lab 11/22/15 0658 11/25/15 0523  WBC 8.7 12.7*  HGB 15.4 14.2  HCT 45.6 42.0  MCV 89.1 88.2  PLT 299 280   Basic Metabolic Panel:  Recent Labs Lab 11/22/15 0658 11/25/15 0523  NA 139 138  K 4.2 3.9  CL 102 100*  CO2 29 32  GLUCOSE 87 106*  BUN 8 10  CREATININE 0.89 0.99  CALCIUM 9.1 9.0   GFR: Estimated Creatinine Clearance: 103.4 mL/min (by C-G formula based on SCr of 0.99 mg/dL). Liver Function Tests:  Recent Labs Lab 11/22/15 0658  AST 41  41  ALT 68*  67*  ALKPHOS 127*  129*  BILITOT 0.6  0.4  PROT 7.9  7.7  ALBUMIN 3.9  3.9   No results for input(s): LIPASE, AMYLASE in the last 168 hours. No results for input(s): AMMONIA in the last 168 hours. Coagulation Profile: No results for input(s): INR, PROTIME in the last 168 hours. Cardiac Enzymes: No results for input(s): CKTOTAL, CKMB, CKMBINDEX, TROPONINI in the last 168 hours. BNP (last 3 results) No results for input(s): PROBNP in the last 8760 hours. HbA1C: No results for  input(s): HGBA1C in the last 72 hours. CBG: No results for input(s): GLUCAP in the last 168 hours. Lipid Profile: No results for input(s): CHOL, HDL, LDLCALC, TRIG, CHOLHDL, LDLDIRECT in the last 72 hours. Thyroid Function Tests: No results for input(s): TSH, T4TOTAL, FREET4, T3FREE, THYROIDAB in the last 72 hours. Anemia Panel: No results for input(s): VITAMINB12, FOLATE, FERRITIN, TIBC, IRON, RETICCTPCT in the last 72 hours. Sepsis Labs: No results for input(s): PROCALCITON, LATICACIDVEN in the last 168 hours.  Recent Results (from the past 240 hour(s))  MRSA PCR Screening     Status: None   Collection Time: 11/25/15  7:09 AM  Result Value Ref Range Status   MRSA by PCR NEGATIVE NEGATIVE Final    Comment:        The GeneXpert MRSA Assay (FDA approved for NASAL specimens only), is one component of a comprehensive MRSA colonization surveillance program. It is not intended to diagnose MRSA infection nor to guide or monitor treatment for MRSA infections.          Radiology Studies: No results found.      Scheduled Meds: . amphetamine-dextroamphetamine  20 mg Oral Daily  . clonazePAM  1 mg Oral BID  . enoxaparin (LOVENOX) injection  40 mg Subcutaneous Q24H  . gabapentin  400 mg Oral TID  . HYDROmorphone      . HYDROmorphone      . HYDROmorphone      . HYDROmorphone      . HYDROmorphone      . HYDROmorphone      . metoprolol succinate  50 mg Oral Daily  . oxyCODONE      . thiamine  100 mg Oral Daily  . vancomycin  1,000 mg Intravenous Q8H  . venlafaxine XR  150 mg Oral Q breakfast   Continuous Infusions:    LOS: 1 day    Time spent: 26 minutes    Marcus Jaynie Collins, MD [facility] Pager [phone]  If 7PM-7AM, please contact night-coverage [email or web address] Password [email or web address] 11/25/2015, 2:23 PM

## 2015-11-25 NOTE — Transfer of Care (Signed)
Immediate Anesthesia Transfer of Care Note  Patient: Marcus [name]  Procedure(s) Performed: Procedure(s): IRRIGATION AND DEBRIDEMENT EXTREMITY RIGHT ARM AND LEFT HAND (Bilateral)  Patient Location: PACU  Anesthesia Type:General  Level of Consciousness:  sedated, patient cooperative and responds to stimulation  Airway & Oxygen Therapy:Patient Spontanous Breathing and Patient connected to face mask oxgen  Post-op Assessment:  Report given to PACU RN and Post -op Vital signs reviewed and stable  Post vital signs:  Reviewed and stable  Last Vitals:  Vitals:   11/25/15 1115 11/25/15 1120  BP:    Pulse:  89  Resp:  10  Temp: 36.5 C     Complications: No apparent anesthesia complications

## 2015-11-25 NOTE — Anesthesia Preprocedure Evaluation (Signed)
Anesthesia Evaluation  Patient identified by MRN, date of birth, ID band Patient awake    Reviewed: Allergy & Precautions, NPO status , Patient's Chart, lab work & pertinent test results  Airway Mallampati: I  TM Distance: >3 FB Neck ROM: Full    Dental  (+) Teeth Intact, Dental Advisory Given   Pulmonary former smoker,    breath sounds clear to auscultation       Cardiovascular  Rhythm:Regular Rate:Normal     Neuro/Psych Seizures -, Poorly Controlled,     GI/Hepatic   Endo/Other    Renal/GU      Musculoskeletal   Abdominal   Peds  Hematology   Anesthesia Other Findings   Reproductive/Obstetrics                             Anesthesia Physical Anesthesia Plan  ASA: II  Anesthesia Plan: General   Post-op Pain Management:    Induction: Intravenous  Airway Management Planned: LMA  Additional Equipment:   Intra-op Plan:   Post-operative Plan: Extubation in OR  Informed Consent: I have reviewed the patients History and Physical, chart, labs and discussed the procedure including the risks, benefits and alternatives for the proposed anesthesia with the patient or authorized representative who has indicated his/her understanding and acceptance.   Dental advisory given  Plan Discussed with: CRNA, Anesthesiologist and Surgeon  Anesthesia Plan Comments:         Anesthesia Quick Evaluation

## 2015-11-26 ENCOUNTER — Encounter (HOSPITAL_COMMUNITY): Payer: Self-pay | Admitting: Orthopaedic Surgery

## 2015-11-26 DIAGNOSIS — Z79899 Other long term (current) drug therapy: Secondary | ICD-10-CM

## 2015-11-26 DIAGNOSIS — Z79891 Long term (current) use of opiate analgesic: Secondary | ICD-10-CM

## 2015-11-26 DIAGNOSIS — L02512 Cutaneous abscess of left hand: Secondary | ICD-10-CM

## 2015-11-26 DIAGNOSIS — Z87891 Personal history of nicotine dependence: Secondary | ICD-10-CM

## 2015-11-26 LAB — CREATININE, SERUM
Creatinine, Ser: 1.15 mg/dL (ref 0.61–1.24)
GFR calc Af Amer: >60 mL/min (ref >60)
GFR calc non Af Amer: >60 mL/min (ref >60)

## 2015-11-26 LAB — VANCOMYCIN, TROUGH: Vancomycin Tr: 21 ug/mL (ref 15–20)

## 2015-11-26 MED ORDER — VANCOMYCIN HCL IN DEXTROSE 750-5 MG/150ML-% IV SOLN
750.0000 mg | Freq: Three times a day (TID) | INTRAVENOUS | Status: DC
  Administered 2015-11-27 (×2): 750 mg via INTRAVENOUS
  Filled 2015-11-26 (×3): qty 150

## 2015-11-26 NOTE — Progress Notes (Addendum)
PHARMACY NOTE -  Vancomycin  See note by Loralee PacasErin Williamson, PharmD, for full details. In brief, this is a 45 y/oM on Vancomycin for right forearm and left hand cellulitis with abscess.    Assessment: -Vancomycin trough level = 21 mcg/mL, slightly supratherapeutic -SCr increased to 1.15 with CrCl ~ 89 ml/min -RN already infused about 3/4 of 1800 1g dose prior to level resulting.   Plan: Stop dose of Vancomycin currently infusing (discussed with RN, Abby).  Decrease Vancomycin to 750mg  IV q8h. F/u cultures, renal function, clinical course, and for transition to PO antibiotics.   Marcus [name], PharmD, BCPS Pager: [phone] 11/26/2015 6:48 PM

## 2015-11-26 NOTE — Progress Notes (Signed)
Pharmacy Antibiotic Note  Marcus [name] is a 45 y.o. male admitted on 11/23/2015 with progressive pain and swelling in his right arm and left hand from cellulitis with abscess.  He was started on PO doxycyline on 9/28, but has progressed.  ED attempted I&D on probable abscess, but found no purulent drainage, only blood.  Now s/p I&D of his right arm and left hand. Pharmacy has been consulted for Vancomycin dosing.  Plan:  Continue Vancomycin 1g IV q8h - per Ortho, needs at least one more day IV abx  Check steady state trough tonight at 17:30 prior to 8th dose  Weight: 201 lb 4.5 oz (91.3 kg)  Temp (24hrs), Avg:98.1 F (36.7 C), Min:97.7 F (36.5 C), Max:98.3 F (36.8 C)   Recent Labs Lab 11/22/15 0658 11/25/15 0523  WBC 8.7 12.7*  CREATININE 0.89 0.99    Estimated Creatinine Clearance: 103.4 mL/min (by C-G formula based on SCr of 0.99 mg/dL).    Allergies  Allergen Reactions  . Sulfa Antibiotics Nausea And Vomiting  . Benadryl [Diphenhydramine Hcl]     " restless leg syndrome"  . Bupropion     Insomnia  . Flexeril  [Cyclobenzaprine Hcl]     Restless leg  . Robaxin  [Methocarbamol]     Restless leg syndrome  . Seroquel [Quetiapine Fumarate]     "Restless leg syndrome"  . Trazodone And Nefazodone     "restless leg syndrome"    Antimicrobials this admission:  9/28 Doxy PO >> 9/29 9/29 Clinda IV >> 9/29 9/30 Vanc >>  Dose adjustments this admission:  10/2 VT at 1730 = ___ on 1g q8h  Microbiology results:  10/1 Abscess: abundant GN coccobacilli, few GPC pairs, GPR; cx pending 10/1 MRSA PCR: neg  Thank you for allowing pharmacy to be a part of this patient's care.  Loralee PacasErin Faust Thorington, PharmD, BCPS Pager: [phone] 11/26/2015 12:25 PM

## 2015-11-26 NOTE — Progress Notes (Signed)
CRITICAL VALUE ALERT  Critical value received:   Vancomycin trough 21  Date of notification:  11/26/2015  Time of notification:  1815  Critical value read back: yes  Nurse who received alert:  Nicoletta BaAbby Avilene Marrin, RN  MD notified (1st page):  Dr. Ronalee BeltsBhandari  Time of first page:  1815  MD notified (2nd page):  Time of second page:  Responding MD:    Time MD responded:
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
## 2015-11-26 NOTE — Progress Notes (Signed)
PROGRESS NOTE    Marcus [name]  ZOX:[id number] DOB: 06-27-1970 DOA: 11/23/2015 PCP: [name], [name], MD    Brief Narrative: 45 year old male with a history of depression, anxiety, low back pain, opioid and benzodiazepine dependence, peripheral vascular disease who [facility] and swelling in right arm and left hand. Patient found to have abscess in right arm and started on IV antibiotics. Orthopedics consulted.  Assessment & Plan:   # Right forearm and left hand cellulitis and abscess: Status post I&D by orthopedics on 10/01. Ortho consult appreciated. Pending culture result, for now continue IV antibiotics, vancomycin. Pain controlled. Continue supportive care. I will discontinue IV pain medicine.  # MDD (major depressive disorder) (HCC): On suicide precautions and sitter at bedside. Continue current medications. On Klonopin and Effexor. Follow-up psychiatrist's evaluation and recommendation. Discussed with psych this am.    #  Reported history of Seizure [facility](HCC): No seizure activity in the hospital. Continue seizure precautions. Advised outpatient follow-up with neurologist.  # Mild transaminitis: As per medical record patient has history of positive hepatitis C antibody. Advised outpatient follow-up.  DVT prophylaxis: Lovenox subcutaneous Code Status: Full code Family Communication: No family present at bedside Disposition Plan: Likely transfer to behavioral health in 1-2 days  Consultants:   Orthopedics  Procedures: Antimicrobials: On IV vancomycin since September 30  Subjective: Patient was seen and examined at bedside. Patient reported significant improvement in upper extremity pain after the procedure. He still requiring oral pain medicine. Denies fever, chills, headache, dizziness, chest pain, shortness of breath, abdominal pain. Objective: Vitals:   11/25/15 1245 11/25/15 1250 11/25/15 2100 11/26/15 0500  BP: (!) 126/109 134/79  123/86 116/72  Pulse: 85 86 92 70  Resp: 17  18 16   Temp:  98.3 F (36.8 C) 98.2 F (36.8 C) 97.7 F (36.5 C)  TempSrc:   Oral Oral  SpO2: 98% 100% 94% 98%  Weight:        Intake/Output Summary (Last 24 hours) at 11/26/15 1338 Last data filed at 11/26/15 [id number]  Gross per 24 hour  Intake              600 ml  Output                0 ml  Net              600 ml   Filed Weights   11/23/15 2200  Weight: 91.3 kg (201 lb 4.5 oz)    Examination:  General exam: Not in distress, looks comfortable Respiratory system: Clear to auscultation bilateral, no crackle or wheeze. Cardiovascular system: Regular rate rhythm, S1-S2 normal. No pedal edema. Gastrointestinal system: Bowel sound positive. Abdomen soft, nontender. Central nervous system: Alert, awake and following commands. Extremities: Right upper extremity and left hand has dressing on. Able to move all fingers. Skin: Dressing applied on right forearm and left hand, no  rashes noticed.  Psychiatry: Judgement and insight appear normal. Denied suicidal ideation.     Data Reviewed: I have personally reviewed following labs and imaging studies  CBC:  Recent Labs Lab 11/22/15 0658 11/25/15 0523  WBC 8.7 12.7*  HGB 15.4 14.2  HCT 45.6 42.0  MCV 89.1 88.2  PLT 299 280   Basic Metabolic Panel:  Recent Labs Lab 11/22/15 0658 11/25/15 0523  NA 139 138  K 4.2 3.9  CL 102 100*  CO2 29 32  GLUCOSE 87 106*  BUN 8 10  CREATININE 0.89 0.99  CALCIUM 9.1 9.0   GFR: Estimated Creatinine Clearance: 103.4 mL/min (by C-G formula based on SCr of 0.99 mg/dL). Liver Function Tests:  Recent Labs Lab 11/22/15 0658  AST 41  41  ALT 68*  67*  ALKPHOS 127*  129*  BILITOT 0.6  0.4  PROT 7.9  7.7  ALBUMIN 3.9  3.9   No results for input(s): LIPASE, AMYLASE in the last 168 hours. No results for input(s): AMMONIA in the last 168 hours. Coagulation Profile: No results for input(s): INR, PROTIME in the last 168  hours. Cardiac Enzymes: No results for input(s): CKTOTAL, CKMB, CKMBINDEX, TROPONINI in the last 168 hours. BNP (last 3 results) No results for input(s): PROBNP in the last 8760 hours. HbA1C: No results for input(s): HGBA1C in the last 72 hours. CBG: No results for input(s): GLUCAP in the last 168 hours. Lipid Profile: No results for input(s): CHOL, HDL, LDLCALC, TRIG, CHOLHDL, LDLDIRECT in the last 72 hours. Thyroid Function Tests: No results for input(s): TSH, T4TOTAL, FREET4, T3FREE, THYROIDAB in the last 72 hours. Anemia Panel: No results for input(s): VITAMINB12, FOLATE, FERRITIN, TIBC, IRON, RETICCTPCT in the last 72 hours. Sepsis Labs: No results for input(s): PROCALCITON, LATICACIDVEN in the last 168 hours.  Recent Results (from the past 240 hour(s))  MRSA PCR Screening     Status: None   Collection Time: 11/25/15  7:09 AM  Result Value Ref Range Status   MRSA by PCR NEGATIVE NEGATIVE Final    Comment:        The GeneXpert MRSA Assay (FDA approved for NASAL specimens only), is one component of a comprehensive MRSA colonization surveillance program. It is not intended to diagnose MRSA infection nor to guide or monitor treatment for MRSA infections.   Aerobic/Anaerobic Culture (surgical/deep wound)     Status: None (Preliminary result)   Collection Time: 11/25/15 12:51 PM  Result Value Ref Range Status   Specimen Description ABSCESS  Final   Special Requests NONE  Final   Gram Stain   Final    ABUNDANT WBC PRESENT, PREDOMINANTLY PMN ABUNDANT GRAM NEGATIVE COCCOBACILLI FEW GRAM POSITIVE COCCI IN PAIRS FEW GRAM POSITIVE RODS    Culture   Final    TOO YOUNG TO READ Performed at [facility]    Report Status PENDING  Incomplete         Radiology Studies: No results found.      Scheduled Meds: . amphetamine-dextroamphetamine  20 mg Oral Daily  . clonazePAM  1 mg Oral BID  . enoxaparin (LOVENOX) injection  40 mg Subcutaneous Q24H  . gabapentin   400 mg Oral TID  . metoprolol succinate  50 mg Oral Daily  . thiamine  100 mg Oral Daily  . vancomycin  1,000 mg Intravenous Q8H  . venlafaxine XR  150 mg Oral Q breakfast   Continuous Infusions:    LOS: 2 days    Time spent: 23 minutes    Dron Jaynie Collins, MD [facility] Pager [phone]  If 7PM-7AM, please contact night-coverage [email or web address] Password [email or web address] 11/26/2015, 1:38 PM
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
## 2015-11-26 NOTE — Progress Notes (Addendum)
LCSWA given permission to contact patient probation officer. Left voicemail.
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
## 2015-11-26 NOTE — Progress Notes (Signed)
Patient ID: Marcus CoupeLonnie R Rathe, male   DOB: Jan 03, 1971, 45 y.o.   MRN: [id number] Feels better overall post I&D of his right arm and left hand.  Dressings changed and both sites look much improved.  Needs at least one more day of IV antibiotics.  Awaiting final cultures to determine which oral antibiotics to have at discharge.

## 2015-11-27 ENCOUNTER — Encounter (HOSPITAL_COMMUNITY): Payer: Self-pay

## 2015-11-27 DIAGNOSIS — F322 Major depressive disorder, single episode, severe without psychotic features: Secondary | ICD-10-CM

## 2015-11-27 MED ORDER — CIPROFLOXACIN HCL 500 MG PO TABS: 500.0000 mg | ORAL_TABLET | Freq: Two times a day (BID) | ORAL | Status: DC

## 2015-11-27 MED ORDER — LEVOFLOXACIN 750 MG PO TABS
750.0000 mg | ORAL_TABLET | Freq: Every day | ORAL | Status: DC
  Administered 2015-11-27: 750 mg via ORAL
  Filled 2015-11-27: qty 1

## 2015-11-27 MED ORDER — MORPHINE SULFATE (PF) 2 MG/ML IV SOLN
2.0000 mg | INTRAVENOUS | Status: DC | PRN
  Administered 2015-11-27: 2 mg via INTRAVENOUS
  Filled 2015-11-27: qty 1

## 2015-11-27 MED ORDER — DOXYCYCLINE HYCLATE 100 MG PO TABS
100.0000 mg | ORAL_TABLET | Freq: Two times a day (BID) | ORAL | Status: DC
  Administered 2015-11-27 – 2015-11-28 (×2): 100 mg via ORAL
  Filled 2015-11-27 (×2): qty 1

## 2015-11-27 MED ORDER — IBUPROFEN 200 MG PO TABS
600.0000 mg | ORAL_TABLET | Freq: Three times a day (TID) | ORAL | Status: DC
  Administered 2015-11-27 – 2015-11-28 (×3): 600 mg via ORAL
  Filled 2015-11-27 (×3): qty 3

## 2015-11-27 MED ORDER — FAMOTIDINE 20 MG PO TABS
10.0000 mg | ORAL_TABLET | Freq: Every day | ORAL | Status: DC
  Administered 2015-11-27: 10 mg via ORAL
  Filled 2015-11-27: qty 1

## 2015-11-27 NOTE — Progress Notes (Signed)
PROGRESS NOTE    Marcus [name]  ZOX:[id number] DOB: 1970-10-25 DOA: 11/23/2015 PCP: [name], [name], MD    Brief Narrative: 45 year old male with a history of depression, anxiety, low back pain, opioid and benzodiazepine dependence, peripheral vascular disease who [facility] pain and swelling in right arm and left hand. Patient found to have abscess in right arm and started on IV antibiotics. S/p I&D by orthpedics.   Assessment & Plan:   # Right forearm and left hand cellulitis and abscess: Status post I&D by orthopedics on 10/01. Ortho consult appreciated. Pending culture results. Gram stain with gram-negative bacilli, gram-positive cocci and gram-positive rods. I will switch to oral doxycycline and ciprofloxacin for total 2 weeks of course. -Patient reported severe pain, uncontrolled with the current oral pain medication. Started IV morphine as needed for pain management. We will continue to follow and taper down pain medication. Also started Motrin 3 times a day. -Also on Neurontin.  # MDD (major depressive disorder) (HCC): Apparently patient was transferred from behavioral health where he was admitted for depression and suicidal ideation. Patient reported no depression or suicidal ideation today. Evaluated by psych yesterday and recommended to discontinue sitter. Continue Effexor and, pain as ordered.    #  Reported history of Seizure [facility](HCC): No seizure activity in the hospital. Continue seizure precautions. Advised outpatient follow-up with neurologist.  # Mild transaminitis: As per medical record patient has history of positive hepatitis C antibody. Advised outpatient follow-up.  DVT prophylaxis: Lovenox subcutaneous Code Status: Full code Family Communication: No family present at bedside Disposition Plan: Likely discharge home tomorrow if pain improves and cultures are back. Consultants:   Orthopedics  Procedures: Antimicrobials:  Discontinue IV vancomycin today and switch to oral doxycycline and ciprofloxacin.  Subjective: Patient was seen and examined at bedside. Patient reported significant pain on his right arm associated with swelling around the incision site. No pain on left hand. Denies fever, chills, nausea, vomiting, chest pain or shortness of breath. Pain not improving with oral medication. Objective: Vitals:   11/27/15 0400 11/27/15 0514 11/27/15 0935 11/27/15 1348  BP:  104/86 104/86 (!) 116/95  Pulse:  79 79 83  Resp:  20  20  Temp:  97.9 F (36.6 C)  98.1 F (36.7 C)  TempSrc:  Oral  Oral  SpO2:  98%  98%  Weight:      Height: 6' (1.829 m)       Intake/Output Summary (Last 24 hours) at 11/27/15 1405 Last data filed at 11/27/15 0700  Gross per 24 hour  Intake              805 ml  Output                0 ml  Net              805 ml   Filed Weights   11/23/15 2200  Weight: 91.3 kg (201 lb 4.5 oz)    Examination:  General exam: Looks comfortable. Respiratory system: Clear to auscultation bilateral, no crackle or wheeze. Cardiovascular system: Regular rate rhythm, S1-S2 normal. No lower extremity edema. Gastrointestinal system: Bowel sound positive. Abdomen soft, nontender. Central nervous system: Alert, awake and following commands. Extremities: Right upper extremity, forearm has dressing applied with minimal swelling. Able to move fingers. Skin: Dressing applied on right forearm and left hand, no  rashes noticed.  Psychiatry: Judgement and insight appear normal. Denied suicidal ideation.     Data  Reviewed: I have personally reviewed following labs and imaging studies  CBC:  Recent Labs Lab 11/22/15 0658 11/25/15 0523  WBC 8.7 12.7*  HGB 15.4 14.2  HCT 45.6 42.0  MCV 89.1 88.2  PLT 299 280   Basic Metabolic Panel:  Recent Labs Lab 11/22/15 0658 11/25/15 0523 11/26/15 1726  NA 139 138  --   K 4.2 3.9  --   CL 102 100*  --   CO2 29 32  --   GLUCOSE 87 106*  --   BUN  8 10  --   CREATININE 0.89 0.99 1.15  CALCIUM 9.1 9.0  --    GFR: Estimated Creatinine Clearance: 89 mL/min (by C-G formula based on SCr of 1.15 mg/dL). Liver Function Tests:  Recent Labs Lab 11/22/15 0658  AST 41  41  ALT 68*  67*  ALKPHOS 127*  129*  BILITOT 0.6  0.4  PROT 7.9  7.7  ALBUMIN 3.9  3.9   No results for input(s): LIPASE, AMYLASE in the last 168 hours. No results for input(s): AMMONIA in the last 168 hours. Coagulation Profile: No results for input(s): INR, PROTIME in the last 168 hours. Cardiac Enzymes: No results for input(s): CKTOTAL, CKMB, CKMBINDEX, TROPONINI in the last 168 hours. BNP (last 3 results) No results for input(s): PROBNP in the last 8760 hours. HbA1C: No results for input(s): HGBA1C in the last 72 hours. CBG: No results for input(s): GLUCAP in the last 168 hours. Lipid Profile: No results for input(s): CHOL, HDL, LDLCALC, TRIG, CHOLHDL, LDLDIRECT in the last 72 hours. Thyroid Function Tests: No results for input(s): TSH, T4TOTAL, FREET4, T3FREE, THYROIDAB in the last 72 hours. Anemia Panel: No results for input(s): VITAMINB12, FOLATE, FERRITIN, TIBC, IRON, RETICCTPCT in the last 72 hours. Sepsis Labs: No results for input(s): PROCALCITON, LATICACIDVEN in the last 168 hours.  Recent Results (from the past 240 hour(s))  MRSA PCR Screening     Status: None   Collection Time: 11/25/15  7:09 AM  Result Value Ref Range Status   MRSA by PCR NEGATIVE NEGATIVE Final    Comment:        The GeneXpert MRSA Assay (FDA approved for NASAL specimens only), is one component of a comprehensive MRSA colonization surveillance program. It is not intended to diagnose MRSA infection nor to guide or monitor treatment for MRSA infections.   Aerobic/Anaerobic Culture (surgical/deep wound)     Status: None (Preliminary result)   Collection Time: 11/25/15 12:51 PM  Result Value Ref Range Status   Specimen Description ABSCESS  Final   Special  Requests NONE  Final   Gram Stain   Final    ABUNDANT WBC PRESENT, PREDOMINANTLY PMN ABUNDANT GRAM NEGATIVE COCCOBACILLI FEW GRAM POSITIVE COCCI IN PAIRS FEW GRAM POSITIVE RODS    Culture   Final    CULTURE REINCUBATED FOR BETTER GROWTH Performed at [facility]    Report Status PENDING  Incomplete         Radiology Studies: No results found.      Scheduled Meds: . amphetamine-dextroamphetamine  20 mg Oral Daily  . clonazePAM  1 mg Oral BID  . enoxaparin (LOVENOX) injection  40 mg Subcutaneous Q24H  . gabapentin  400 mg Oral TID  . ibuprofen  600 mg Oral TID  . metoprolol succinate  50 mg Oral Daily  . thiamine  100 mg Oral Daily  . vancomycin  750 mg Intravenous Q8H  . venlafaxine XR  150 mg Oral Q breakfast  Continuous Infusions:    LOS: 3 days    Time spent: 22 minutes    Dron Jaynie Collins, MD [facility] Pager [phone]  If 7PM-7AM, please contact night-coverage [email or web address] Password [email or web address] 11/27/2015, 2:05 PM

## 2015-11-27 NOTE — Progress Notes (Signed)
LCSWA met with patient Marcus [name], manufacturing systems in regards to patient length of stay and possible discharge.Per patient request and permission.

## 2015-11-27 NOTE — Progress Notes (Signed)
Patient ID: Marcus [name], male   DOB: [birth year], 45 y.o.   MRN: [id number] Can be discharged from an Ortho standpoint, but will need 2 weeks of oral antibiotics to cover gram positive cocci and rods as well as gram neg.  Likely doxycycline 100 mg BID and cipro 500 mg bid

## 2015-11-28 ENCOUNTER — Encounter (HOSPITAL_COMMUNITY): Payer: Self-pay

## 2015-11-28 DIAGNOSIS — L02413 Cutaneous abscess of right upper limb: Principal | ICD-10-CM

## 2015-11-28 MED ORDER — OXYCODONE HCL 5 MG PO TABS: 5.0000 mg | ORAL_TABLET | Freq: Four times a day (QID) | ORAL | 0 refills | Status: AC | PRN

## 2015-11-28 MED ORDER — IBUPROFEN 600 MG PO TABS: 600.0000 mg | ORAL_TABLET | Freq: Three times a day (TID) | ORAL | 0 refills | Status: AC

## 2015-11-28 MED ORDER — DOXYCYCLINE HYCLATE 100 MG PO TABS: 100.0000 mg | ORAL_TABLET | Freq: Two times a day (BID) | ORAL | 0 refills | Status: AC

## 2015-11-28 MED ORDER — LEVOFLOXACIN 750 MG PO TABS: 750.0000 mg | ORAL_TABLET | Freq: Every day | ORAL | 0 refills | Status: AC

## 2015-11-28 NOTE — Discharge Instructions (Signed)
You can get your current dressing wet daily in the shower. You can remove your dressings on 10/6 and start getting your actual incisions wet daily New dry dressing or band-aids daily starting 10/6.    Abscess An abscess is an infected area that contains a collection of pus and debris.It can occur in almost any part of the body. An abscess is also known as a furuncle or boil. CAUSES  An abscess occurs when tissue gets infected. This can occur from blockage of oil or sweat glands, infection of hair follicles, or a minor injury to the skin. As the body tries to fight the infection, pus collects in the area and creates pressure under the skin. This pressure causes pain. People with weakened immune systems have difficulty fighting infections and get certain abscesses more often.  SYMPTOMS Usually an abscess develops on the skin and becomes a painful mass that is red, warm, and tender. If the abscess forms under the skin, you may feel a moveable soft area under the skin. Some abscesses break open (rupture) on their own, but most will continue to get worse without care. The infection can spread deeper into the body and eventually into the bloodstream, causing you to feel ill.  DIAGNOSIS  Your caregiver will take your medical history and perform a physical exam. A sample of fluid may also be taken from the abscess to determine what is causing your infection. TREATMENT  Your caregiver may prescribe antibiotic medicines to fight the infection. However, taking antibiotics alone usually does not cure an abscess. Your caregiver may need to make a small cut (incision) in the abscess to drain the pus. In some cases, gauze is packed into the abscess to reduce pain and to continue draining the area. HOME CARE INSTRUCTIONS   Only take over-the-counter or prescription medicines for pain, discomfort, or fever as directed by your caregiver.  If you were prescribed antibiotics, take them as directed. Finish them even  if you start to feel better.  If gauze is used, follow your caregiver's directions for changing the gauze.  To avoid spreading the infection:  Keep your draining abscess covered with a bandage.  Wash your hands well.  Do not share personal care items, towels, or whirlpools with others.  Avoid skin contact with others.  Keep your skin and clothes clean around the abscess.  Keep all follow-up appointments as directed by your caregiver. SEEK MEDICAL CARE IF:   You have increased pain, swelling, redness, fluid drainage, or bleeding.  You have muscle aches, chills, or a general ill feeling.  You have a fever. MAKE SURE YOU:   Understand these instructions.  Will watch your condition.  Will get help right away if you are not doing well or get worse.   This information is not intended to replace advice given to you by your health care provider. Make sure you discuss any questions you have with your health care provider.   Document Released: 11/20/2004 Document Revised: 08/12/2011 Document Reviewed: 04/25/2011 Elsevier Interactive Patient Education [employer]2016 Elsevier Inc.

## 2015-11-28 NOTE — Progress Notes (Signed)
Pt discharge instructions were reviewed with the pt. Pt is currently waiting for ride. No questions or concerns at this time. Josejuan Hoaglin W Cipriana Biller, RN

## 2015-11-28 NOTE — Discharge Summary (Signed)
[facility]  Physician Discharge Summary   Patient ID: Marcus [name] MRN: [id number] DOB/AGE: 02/28/1970 45 y.o.  Admit date: 11/23/2015 Discharge date: 11/28/2015  PCP: Tarri Fuller, MD  DISCHARGE DIAGNOSES:  Principal Problem:   MDD (major depressive disorder) (HCC) Active Problems:   Abscess of left arm   Seizure (HCC)   Abscess of right forearm   Abscess of left hand   RECOMMENDATIONS FOR OUTPATIENT FOLLOW UP: 1. Patient to follow-up with Dr. Magnus Ivan for wound recheck in 2 weeks   DISCHARGE CONDITION: fair  Diet recommendation: As before  Filed Weights   11/23/15 2200  Weight: 91.3 kg (201 lb 4.5 oz)    INITIAL HISTORY: 45 year old male with a history of depression, anxiety, low back pain, opioid and benzodiazepine dependence, peripheral vascular disease who was brought to ED from [facility] for progressive pain and swelling in right arm and left hand. Patient found to have abscess in right armand started on IV antibiotics. S/p I&D by orthpedics.   Consultations:  Dr. Magnus Ivan with orthopedics  Procedures:  Incision and drainage of her right forearm abscess and left hand abscess   HOSPITAL COURSE:   Right forearm and left hand cellulitis and abscess Status post I&D by orthopedics on 10/01. Pending culture results but Gram stain with gram-negative bacilli, gram-positive cocci and gram-positive rods. After discussions with orthopedics. Patient was changed over to oral doxycycline and Levaquin. He will take these antibiotics for 2 weeks. Patient's pain is reasonably well controlled. He'll be prescribed pain medications as well.   MDD (major depressive disorder) Apparently patient was transferred from behavioral health where he was admitted for depression and suicidal ideation. Patient reported no depression or suicidal ideation . During his stay in the hospital. Evaluated by psych and recommended to discontinue sitter. Continue Effexor and, pain as  ordered. No need for further inpatient psychiatric care.   Reported history of Seizure No seizure activity in the hospital. Continue seizure precautions. Advised outpatient follow-up with neurologist.  Mild transaminitis As per medical record patient has history of positive hepatitis C antibody. Advised outpatient follow-up.  Overall improved. Patient feels much better this morning. Wants to go home. Okay for discharge home today.   PERTINENT LABS:  The results of significant diagnostics from this hospitalization (including imaging, microbiology, ancillary and laboratory) are listed below for reference.    Microbiology: Recent Results (from the past 240 hour(s))  MRSA PCR Screening     Status: None   Collection Time: 11/25/15  7:09 AM  Result Value Ref Range Status   MRSA by PCR NEGATIVE NEGATIVE Final    Comment:        The GeneXpert MRSA Assay (FDA approved for NASAL specimens only), is one component of a comprehensive MRSA colonization surveillance program. It is not intended to diagnose MRSA infection nor to guide or monitor treatment for MRSA infections.   Aerobic/Anaerobic Culture (surgical/deep wound)     Status: None (Preliminary result)   Collection Time: 11/25/15 12:51 PM  Result Value Ref Range Status   Specimen Description ABSCESS  Final   Special Requests NONE  Final   Gram Stain   Final    ABUNDANT WBC PRESENT, PREDOMINANTLY PMN ABUNDANT GRAM NEGATIVE COCCOBACILLI FEW GRAM POSITIVE COCCI IN PAIRS FEW GRAM POSITIVE RODS Performed at [facility]    Culture FEW STREPTOCOCCUS GROUP C  Final   Report Status PENDING  Incomplete     Labs: Basic Metabolic Panel:  Recent Labs Lab 11/22/15 0658 11/25/15 0523 11/26/15  1726  NA 139 138  --   K 4.2 3.9  --   CL 102 100*  --   CO2 29 32  --   GLUCOSE 87 106*  --   BUN 8 10  --   CREATININE 0.89 0.99 1.15  CALCIUM 9.1 9.0  --    Liver Function Tests:  Recent Labs Lab 11/22/15 0658  AST 41   41  ALT 68*  67*  ALKPHOS 127*  129*  BILITOT 0.6  0.4  PROT 7.9  7.7  ALBUMIN 3.9  3.9   CBC:  Recent Labs Lab 11/22/15 0658 11/25/15 0523  WBC 8.7 12.7*  HGB 15.4 14.2  HCT 45.6 42.0  MCV 89.1 88.2  PLT 299 280     IMAGING STUDIES No results found.  DISCHARGE EXAMINATION: Vitals:   11/27/15 1348 11/27/15 2110 11/28/15 0616 11/28/15 0953  BP: (!) 116/95 114/79 124/81 129/73  Pulse: 83 87 65 83  Resp: 20 16 17    Temp: 98.1 F (36.7 C) 98.1 F (36.7 C) 97.6 F (36.4 C)   TempSrc: Oral Oral Oral   SpO2: 98% 96% 100%   Weight:      Height:       General appearance: alert, cooperative, appears stated age and no distress Resp: clear to auscultation bilaterally Cardio: regular rate and rhythm, S1, S2 normal, no murmur, click, rub or gallop GI: soft, non-tender; bowel sounds normal; no masses,  no organomegaly Extremities: Dressing noted over the right forearm. No erythema noted.   DISPOSITION: Home  Discharge Instructions    Call MD for:  difficulty breathing, headache or visual disturbances    Complete by:  As directed    Call MD for:  extreme fatigue    Complete by:  As directed    Call MD for:  persistant dizziness or light-headedness    Complete by:  As directed    Call MD for:  persistant nausea and vomiting    Complete by:  As directed    Call MD for:  redness, tenderness, or signs of infection (pain, swelling, redness, odor or green/yellow discharge around incision site)    Complete by:  As directed    Call MD for:  severe uncontrolled pain    Complete by:  As directed    Call MD for:  temperature >100.4    Complete by:  As directed    Diet general    Complete by:  As directed    Discharge instructions    Complete by:  As directed    Please take all of your antibiotics as recommended. We should've follow-up with your PCP and with Dr. Magnus IvanBlackman.  You were cared for by a hospitalist during your hospital stay. If you have any questions about  your discharge medications or the care you received while you were in the hospital after you are discharged, you can call the unit and asked to speak with the hospitalist on call if the hospitalist that took care of you is not available. Once you are discharged, your primary care physician will handle any further medical issues. Please note that NO REFILLS for any discharge medications will be authorized once you are discharged, as it is imperative that you return to your primary care physician (or establish a relationship with a primary care physician if you do not have one) for your aftercare needs so that they can reassess your need for medications and monitor your lab values. If you do not have a  primary care physician, you can call [phone] for a physician referral.   Increase activity slowly    Complete by:  As directed       ALLERGIES:  Allergies  Allergen Reactions  . Sulfa Antibiotics Nausea And Vomiting  . Benadryl [Diphenhydramine Hcl]     " restless leg syndrome"  . Bupropion     Insomnia  . Flexeril  [Cyclobenzaprine Hcl]     Restless leg  . Robaxin  [Methocarbamol]     Restless leg syndrome  . Seroquel [Quetiapine Fumarate]     "Restless leg syndrome"  . Trazodone And Nefazodone     "restless leg syndrome"     Current Discharge Medication List    START taking these medications   Details  doxycycline (VIBRA-TABS) 100 MG tablet Take 1 tablet (100 mg total) by mouth every 12 (twelve) hours. Qty: 28 tablet, Refills: 0    ibuprofen (ADVIL,MOTRIN) 600 MG tablet Take 1 tablet (600 mg total) by mouth 3 (three) times daily. Qty: 30 tablet, Refills: 0    levofloxacin (LEVAQUIN) 750 MG tablet Take 1 tablet (750 mg total) by mouth at bedtime. Qty: 14 tablet, Refills: 0    oxyCODONE (OXY IR/ROXICODONE) 5 MG immediate release tablet Take 1-2 tablets (5-10 mg total) by mouth every 6 (six) hours as needed for severe pain. Qty: 15 tablet, Refills: 0      CONTINUE these  medications which have NOT CHANGED   Details  acetaminophen (TYLENOL) 500 MG tablet Take 1,000 mg by mouth daily as needed for moderate pain.    albuterol (PROVENTIL HFA;VENTOLIN HFA) 108 (90 Base) MCG/ACT inhaler Inhale 2 puffs into the lungs every 4 (four) hours as needed for wheezing or shortness of breath.    amphetamine-dextroamphetamine (ADDERALL XR) 20 MG 24 hr capsule Take 20 mg by mouth daily.    cholecalciferol (VITAMIN D) 1000 units tablet Take 1,000 Units by mouth daily.    clonazePAM (KLONOPIN) 1 MG tablet Take 1 mg by mouth 2 (two) times daily.    gabapentin (NEURONTIN) 400 MG capsule Take 400 mg by mouth 3 (three) times daily.    metoprolol succinate (TOPROL-XL) 50 MG 24 hr tablet Take 50 mg by mouth daily. Take with or immediately following a meal.    SUMAtriptan (IMITREX) 100 MG tablet Take 100 mg by mouth every 2 (two) hours as needed for migraine. May repeat in 2 hours if headache persists or recurs.    testosterone cypionate (DEPOTESTOSTERONE CYPIONATE) 200 MG/ML injection Inject 200 mg into the muscle every 14 (fourteen) days.    thiamine (VITAMIN B-1) 100 MG tablet Take 100 mg by mouth daily.    tiZANidine (ZANAFLEX) 4 MG tablet Take 4 mg by mouth every 8 (eight) hours as needed for muscle spasms.    venlafaxine XR (EFFEXOR-XR) 150 MG 24 hr capsule Take 150 mg by mouth daily with breakfast.      STOP taking these medications     hydrOXYzine (ATARAX/VISTARIL) 50 MG tablet      oxyCODONE-acetaminophen (PERCOCET) 5-325 MG per tablet          Follow-up Information    Kathryne Hitch, MD. Schedule an appointment as soon as possible for a visit in 2 week(s).   Specialty:  Orthopedic Surgery Contact information: [address] [place] [address] [phone]        Tarri Fuller, MD Follow up in 1 week(s).   Specialty:  Family Medicine Contact information: [address] [place] [address]  [phone]            TOTAL DISCHARGE TIME: 35 minutes  [facility]  [facility] Pager [phone]  11/28/2015, 2:34 PM

## 2015-12-01 LAB — AEROBIC/ANAEROBIC CULTURE W GRAM STAIN (SURGICAL/DEEP WOUND)

## 2015-12-07 NOTE — Discharge Summary (Signed)
Physician Discharge Summary Note  Patient:  Marcus [name] is an 45 y.o., male MRN:  [id number] DOB:  1970-05-28 Patient phone:  [phone] (home)  Patient address:   [address] [place] [address],  Total Time spent with patient: 20 minutes  Date of Admission:  11/21/2015 Date of Discharge: 11/23/2015  Reason for Admission:  Multiple depressive and anxiety symptoms along with suicide ideations  Principal Problem: Abscess of right arm Discharge Diagnoses: Patient Active Problem List   Diagnosis Date Noted  . Abscess of left hand [L02.512]   . Abscess of right forearm [L02.413]   . Cellulitis of left hand [L03.114] 11/23/2015  . Cellulitis of arm, right [L03.113] 11/23/2015  . Abscess of right arm [L02.413] 11/23/2015  . Seizure (HCC) [R56.9] 11/23/2015  . Cellulitis of left arm [L03.114] 11/22/2015  . Abscess of left arm [L02.414] 11/22/2015  . MDD (major depressive disorder), recurrent episode, severe (HCC) [F33.2] 11/21/2015  . Foot laceration involving tendon [O13.086V, H84.696E] 07/31/2014  . Benzodiazepine dependence (HCC) [F13.20] 03/23/2014  . Alcohol intoxication (HCC) [F10.129]   . Overdose [T50.901A]   . Major depressive disorder, recurrent, severe without psychotic features (HCC) [F33.2]   . Opioid dependence with opioid-induced mood disorder (HCC) [F11.24]   . MDD (major depressive disorder) (HCC) [F32.9] 01/11/2014  . Opiate abuse, continuous [F11.10] 05/20/2012    Class: Acute  . Benzodiazepine abuse, continuous [F13.10] 05/20/2012    Class: Acute    Past Psychiatric History: see admission assessment  Past Medical History:  Past Medical History:  Diagnosis Date  . Chronic back pain   . Suicidal overdose [facility])     Past Surgical History:  Procedure Laterality Date  . I&D EXTREMITY Bilateral 11/25/2015   Procedure: IRRIGATION AND DEBRIDEMENT EXTREMITY RIGHT ARM AND LEFT HAND;  Surgeon: Kathryne Hitch, MD;  Location: [facility] ORS;  Service:  Orthopedics;  Laterality: Bilateral;  . None    . TENDON REPAIR Left 07/31/2014   Procedure: LEFT FOOT TENDON REPAIR;  Surgeon: Ollen Gross, MD;  Location: [facility];  Service: Orthopedics;  Laterality: Left;   Family History:  Family History  Problem Relation Age of Onset  . Family history unknown: Yes   Family Psychiatric  History: see admission assessment Social History:  History  Alcohol Use  . Yes    Comment: occ.     History  Drug Use  . Types: Heroin    Comment: has been off methadone since 11/02/13    Social History   Social History  . Marital status: Legally Separated    Spouse name: N/A  . Number of children: 1  . Years of education: N/A   Social History Main Topics  . Smoking status: Former Games developer  . Smokeless tobacco: Current User    Types: Chew  . Alcohol use Yes     Comment: occ.  . Drug use:     Types: Heroin     Comment: has been off methadone since 11/02/13  . Sexual activity: Yes    Partners: Female    Birth control/ protection: None   Other Topics Concern  . None   Social History Narrative  . None    Hospital Course:  Patient had an indurated area that looked infected, surgery was consulted who recommended a transfer to ed for further assessment  Physical Findings: AIMS: Facial and Oral Movements Muscles of Facial Expression: None, normal Lips and Perioral Area: None, normal Jaw: None, normal Tongue: None, normal,Extremity Movements Upper (arms, wrists, hands,  fingers): None, normal Lower (legs, knees, ankles, toes): None, normal, Trunk Movements Neck, shoulders, hips: None, normal, Overall Severity Severity of abnormal movements (highest score from questions above): None, normal Incapacitation due to abnormal movements: None, normal Patient's awareness of abnormal movements (rate only patient's report): No Awareness, Dental Status Current problems with teeth and/or dentures?: No Does patient usually wear dentures?: No  CIWA:    COWS:      Musculoskeletal: Strength & Muscle Tone: see progress note of 11/23/15  Psychiatric Specialty Exam:see progress note of 11/23/15 Physical Exam  ROS  Blood pressure 121/86, pulse 68, temperature 97.7 F (36.5 C), temperature source Oral, resp. rate 18, height 6' (1.829 m), weight 90.3 kg (199 lb), SpO2 96 %.Body mass index is 26.99 kg/m.     Have you used any form of tobacco in the last 30 days? (Cigarettes, Smokeless Tobacco, Cigars, and/or Pipes): Yes  Has this patient used any form of tobacco in the last 30 days? (Cigarettes, Smokeless Tobacco, Cigars, and/or Pipes) Yes, N/A  Blood Alcohol level:  Lab Results  Component Value Date   ETH 122 (H) 03/21/2014   ETH <11 01/10/2014    Metabolic Disorder Labs:  Lab Results  Component Value Date   HGBA1C 5.1 11/22/2015   MPG 100 11/22/2015   MPG 114 01/13/2014   Lab Results  Component Value Date   PROLACTIN 16.9 (H) 11/22/2015   Lab Results  Component Value Date   CHOL 176 11/22/2015   TRIG 189 (H) 11/22/2015   HDL 32 (L) 11/22/2015   CHOLHDL 5.5 11/22/2015   VLDL 38 11/22/2015   LDLCALC 106 (H) 11/22/2015    See Psychiatric Specialty Exam and Suicide Risk Assessment completed by Attending Physician prior to discharge.  Discharge destination:  Other:  [facility]  Is patient on multiple antipsychotic therapies at discharge:  No   Has Patient had three or more failed trials of antipsychotic monotherapy by history:  No  Recommended Plan for Multiple Antipsychotic Therapies: NA     Medication List    ASK your doctor about these medications     Indication  acetaminophen 500 MG tablet Commonly known as:  TYLENOL Take 1,000 mg by mouth daily as needed for moderate pain.    albuterol 108 (90 Base) MCG/ACT inhaler Commonly known as:  PROVENTIL HFA;VENTOLIN HFA Inhale 2 puffs into the lungs every 4 (four) hours as needed for wheezing or shortness of breath.    amphetamine-dextroamphetamine 20 MG 24 hr capsule Commonly  known as:  ADDERALL XR Take 20 mg by mouth daily.    cholecalciferol 1000 units tablet Commonly known as:  VITAMIN D Take 1,000 Units by mouth daily.    clonazePAM 1 MG tablet Commonly known as:  KLONOPIN Take 1 mg by mouth 2 (two) times daily.    gabapentin 400 MG capsule Commonly known as:  NEURONTIN Take 400 mg by mouth 3 (three) times daily.    metoprolol succinate 50 MG 24 hr tablet Commonly known as:  TOPROL-XL Take 50 mg by mouth daily. Take with or immediately following a meal.    SUMAtriptan 100 MG tablet Commonly known as:  IMITREX Take 100 mg by mouth every 2 (two) hours as needed for migraine. May repeat in 2 hours if headache persists or recurs.    testosterone cypionate 200 MG/ML injection Commonly known as:  DEPOTESTOSTERONE CYPIONATE Inject 200 mg into the muscle every 14 (fourteen) days.    thiamine 100 MG tablet Commonly known as:  VITAMIN B-1 Take  100 mg by mouth daily.    tiZANidine 4 MG tablet Commonly known as:  ZANAFLEX Take 4 mg by mouth every 8 (eight) hours as needed for muscle spasms.    venlafaxine XR 150 MG 24 hr capsule Commonly known as:  EFFEXOR-XR Take 150 mg by mouth daily with breakfast.       Follow-up Information    Pt reports that he has an established provider and will follow-up on his own. .           Follow-up recommendations:  Activity:  tRANSFER TP [facility]  Comments:  None  Signed: Wynelle Bourgeois, MD 12/07/2015, 9:14 AM

## 2015-12-13 ENCOUNTER — Inpatient Hospital Stay (INDEPENDENT_AMBULATORY_CARE_PROVIDER_SITE_OTHER): Payer: Self-pay | Admitting: Orthopaedic Surgery

## 2016-01-25 DEATH — deceased
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
